# Patient Record
Sex: Female | Born: 1955 | Race: White | Hispanic: No | Marital: Single | Smoking: Former smoker
Health system: Southern US, Community
[De-identification: ages and names within clinical notes are randomized; demographics above are authoritative.]

## PROBLEM LIST (undated history)

## (undated) DIAGNOSIS — K219 Gastro-esophageal reflux disease without esophagitis: Secondary | ICD-10-CM

## (undated) DIAGNOSIS — G5760 Lesion of plantar nerve, unspecified lower limb: Secondary | ICD-10-CM

## (undated) DIAGNOSIS — K589 Irritable bowel syndrome without diarrhea: Secondary | ICD-10-CM

## (undated) DIAGNOSIS — M810 Age-related osteoporosis without current pathological fracture: Secondary | ICD-10-CM

## (undated) DIAGNOSIS — K259 Gastric ulcer, unspecified as acute or chronic, without hemorrhage or perforation: Secondary | ICD-10-CM

## (undated) DIAGNOSIS — C801 Malignant (primary) neoplasm, unspecified: Secondary | ICD-10-CM

## (undated) HISTORY — DX: Irritable bowel syndrome without diarrhea: K58.9

## (undated) HISTORY — DX: Gastric ulcer, unspecified as acute or chronic, without hemorrhage or perforation: K25.9

## (undated) HISTORY — PX: BREAST BIOPSY: SHX20

## (undated) HISTORY — PX: BREAST CYST ASPIRATION: SHX578

## (undated) HISTORY — DX: Gastro-esophageal reflux disease without esophagitis: K21.9

## (undated) HISTORY — DX: Age-related osteoporosis without current pathological fracture: M81.0

## (undated) HISTORY — DX: Lesion of plantar nerve, unspecified lower limb: G57.60

---

## 1978-09-20 DIAGNOSIS — C801 Malignant (primary) neoplasm, unspecified: Secondary | ICD-10-CM

## 1978-09-20 HISTORY — DX: Malignant (primary) neoplasm, unspecified: C80.1

## 2004-01-22 ENCOUNTER — Emergency Department: Payer: Self-pay | Admitting: Emergency Medicine

## 2004-07-30 ENCOUNTER — Ambulatory Visit: Payer: Self-pay

## 2004-08-07 ENCOUNTER — Ambulatory Visit: Payer: Self-pay

## 2004-10-22 ENCOUNTER — Emergency Department: Payer: Self-pay | Admitting: Emergency Medicine

## 2005-03-11 ENCOUNTER — Ambulatory Visit: Payer: Self-pay

## 2007-02-22 IMAGING — US ULTRASOUND RIGHT BREAST
1 series · 9 of 9 positions shown · non-contrast
Comparison: none

REASON FOR EXAM: Mobile nodule, 7 o'clock right breast
COMMENTS:

[Series 1: ultrasound right breast · 9 of 9 slices shown]
[im 1/9]
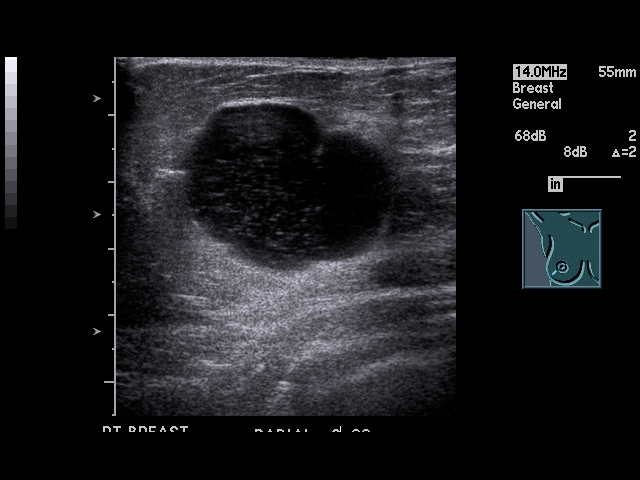
[im 2/9]
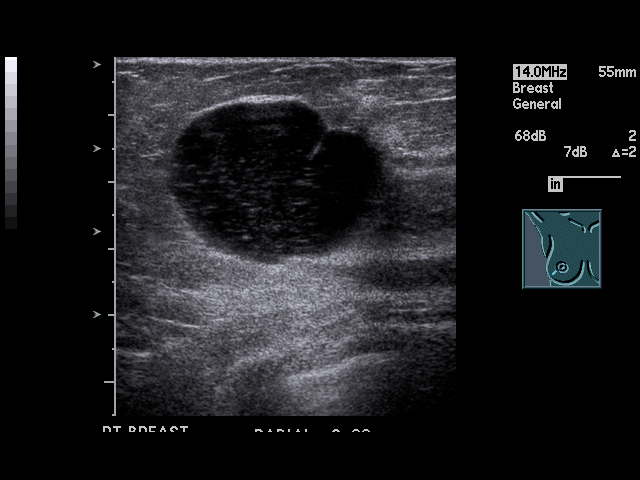
[im 3/9]
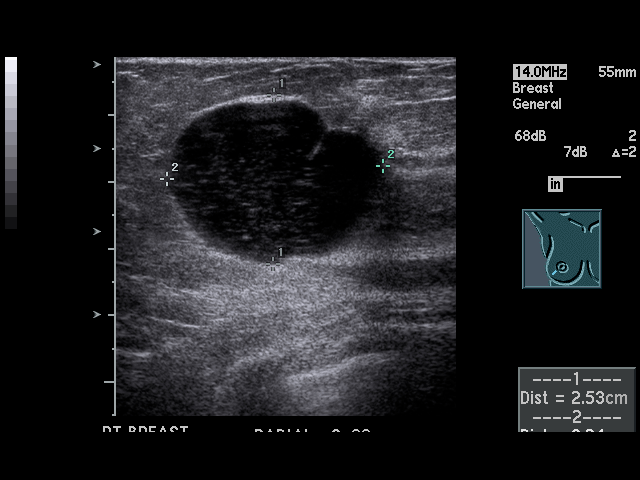
[im 4/9]
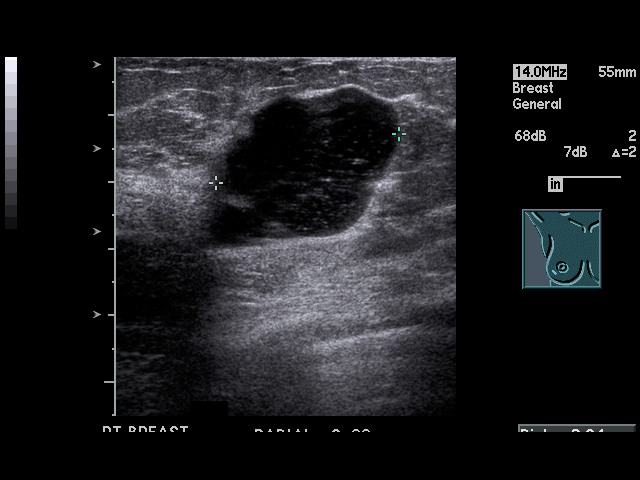
[im 5/9]
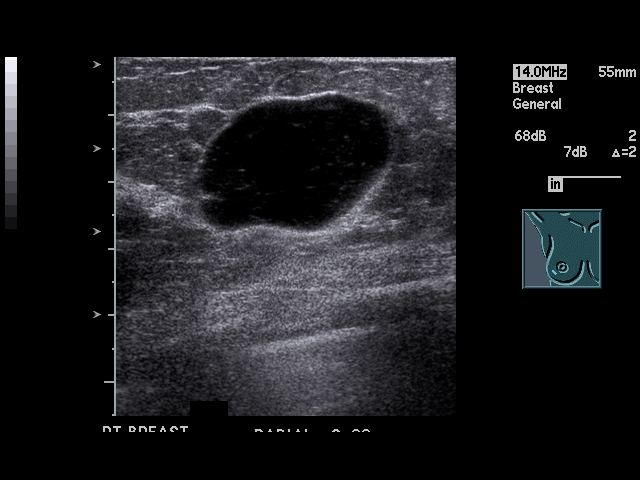
[im 6/9]
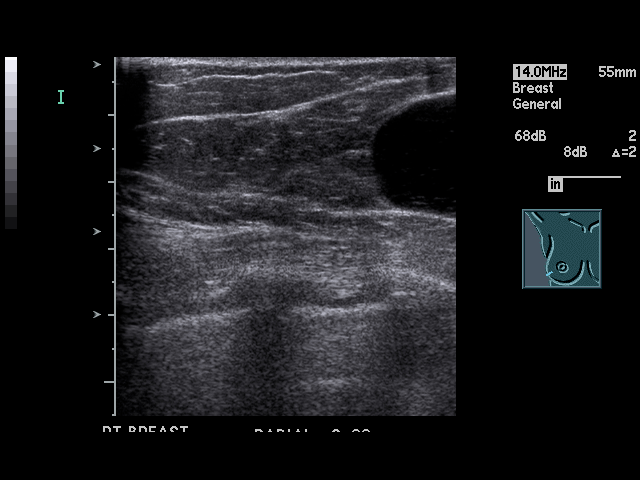
[im 7/9]
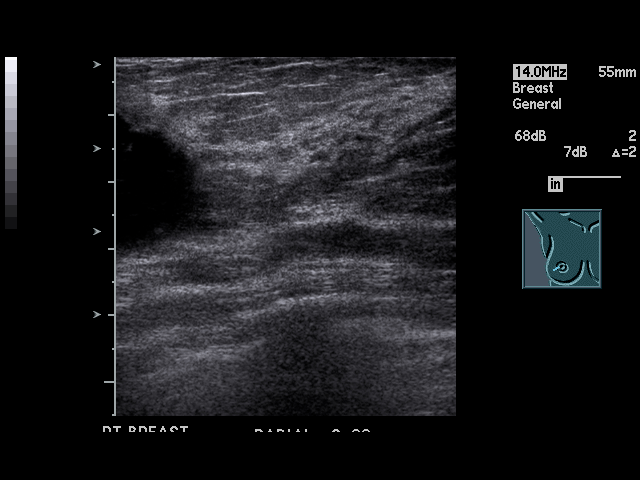
[im 8/9]
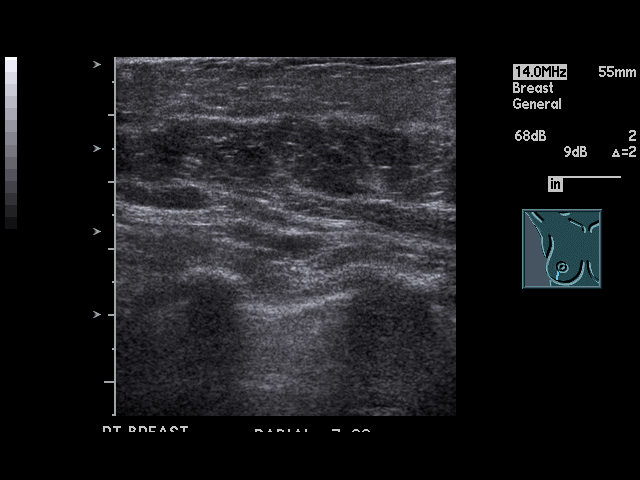
[im 9/9]
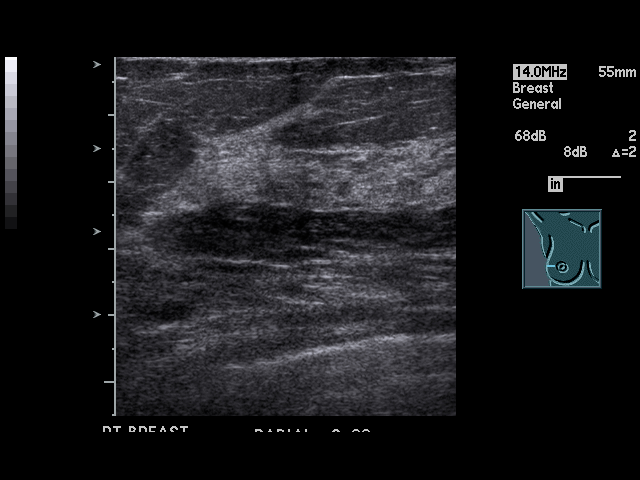

[9 of 9 positions shown; findings below may reference images not displayed]

PROCEDURE:     US  - US BREAST RIGHT  - July 30, 2004 [DATE]

RESULT:     The RIGHT breast is evaluated within the region of interest from
the 8 o'clock to 9 o'clock position.

A cystic structure is appreciated in the 8 o'clock position measuring 2.53 x
3.2 x 2.84 cm. This area does demonstrate increased through transmission
though there are internal echoes within this cyst and areas of possible wall
thickening. Due to these findings, further evaluation with cyst aspiration
is recommended. No further sonographic abnormalities are appreciated.
IMPRESSION: Cystic structure appreciated at the 8 o'clock position as
described above. Due to the sonographic characteristics, further evaluation
with aspiration is recommended.

## 2007-10-26 DIAGNOSIS — G2581 Restless legs syndrome: Secondary | ICD-10-CM | POA: Insufficient documentation

## 2007-12-06 ENCOUNTER — Ambulatory Visit: Payer: Self-pay

## 2008-10-18 DIAGNOSIS — M24469 Recurrent dislocation, unspecified knee: Secondary | ICD-10-CM | POA: Insufficient documentation

## 2009-03-13 DIAGNOSIS — J309 Allergic rhinitis, unspecified: Secondary | ICD-10-CM | POA: Insufficient documentation

## 2009-06-04 ENCOUNTER — Ambulatory Visit: Payer: Self-pay

## 2009-08-29 DIAGNOSIS — L570 Actinic keratosis: Secondary | ICD-10-CM | POA: Insufficient documentation

## 2010-06-18 ENCOUNTER — Ambulatory Visit: Payer: Self-pay | Admitting: Family Medicine

## 2011-12-29 ENCOUNTER — Ambulatory Visit: Payer: Self-pay

## 2012-07-18 DIAGNOSIS — J358 Other chronic diseases of tonsils and adenoids: Secondary | ICD-10-CM | POA: Insufficient documentation

## 2013-03-27 DIAGNOSIS — C44111 Basal cell carcinoma of skin of unspecified eyelid, including canthus: Secondary | ICD-10-CM | POA: Insufficient documentation

## 2013-08-14 DIAGNOSIS — R195 Other fecal abnormalities: Secondary | ICD-10-CM | POA: Insufficient documentation

## 2013-09-04 ENCOUNTER — Ambulatory Visit: Payer: Self-pay | Admitting: Gastroenterology

## 2013-09-04 LAB — KOH PREP

## 2013-09-06 LAB — PATHOLOGY REPORT

## 2013-09-18 DIAGNOSIS — G43119 Migraine with aura, intractable, without status migrainosus: Secondary | ICD-10-CM | POA: Insufficient documentation

## 2013-10-09 DIAGNOSIS — K253 Acute gastric ulcer without hemorrhage or perforation: Secondary | ICD-10-CM | POA: Insufficient documentation

## 2013-10-09 DIAGNOSIS — K2100 Gastro-esophageal reflux disease with esophagitis, without bleeding: Secondary | ICD-10-CM | POA: Insufficient documentation

## 2013-11-20 ENCOUNTER — Ambulatory Visit: Payer: Self-pay | Admitting: Gastroenterology

## 2013-12-21 DIAGNOSIS — M810 Age-related osteoporosis without current pathological fracture: Secondary | ICD-10-CM | POA: Insufficient documentation

## 2013-12-21 DIAGNOSIS — D649 Anemia, unspecified: Secondary | ICD-10-CM | POA: Insufficient documentation

## 2013-12-21 DIAGNOSIS — L509 Urticaria, unspecified: Secondary | ICD-10-CM | POA: Insufficient documentation

## 2014-05-14 LAB — SURGICAL PATHOLOGY

## 2014-06-04 DIAGNOSIS — K5790 Diverticulosis of intestine, part unspecified, without perforation or abscess without bleeding: Secondary | ICD-10-CM | POA: Insufficient documentation

## 2015-01-28 ENCOUNTER — Ambulatory Visit: Payer: Self-pay

## 2015-02-04 ENCOUNTER — Ambulatory Visit: Payer: Medicaid Other

## 2015-02-04 ENCOUNTER — Encounter: Payer: Self-pay | Admitting: Podiatry

## 2015-02-04 ENCOUNTER — Ambulatory Visit (INDEPENDENT_AMBULATORY_CARE_PROVIDER_SITE_OTHER): Payer: Medicaid Other | Admitting: Podiatry

## 2015-02-04 ENCOUNTER — Ambulatory Visit (INDEPENDENT_AMBULATORY_CARE_PROVIDER_SITE_OTHER): Payer: Medicaid Other

## 2015-02-04 VITALS — BP 124/82 | HR 70 | Resp 16 | Ht 61.0 in | Wt 170.0 lb

## 2015-02-04 DIAGNOSIS — M722 Plantar fascial fibromatosis: Secondary | ICD-10-CM | POA: Diagnosis not present

## 2015-02-04 DIAGNOSIS — M79673 Pain in unspecified foot: Secondary | ICD-10-CM

## 2015-02-04 MED ORDER — TRIAMCINOLONE ACETONIDE 10 MG/ML IJ SUSP
10.0000 mg | Freq: Once | INTRAMUSCULAR | Status: AC
  Administered 2015-02-04: 10 mg

## 2015-02-04 NOTE — Progress Notes (Signed)
   Subjective:    Patient ID: Amy Bell, female    DOB: 11/21/1955, 60 y.o.   MRN: [id number]  HPI Patient presents with foot pain in their Left foot; Spot on tarsal joint (looks like vein). On Right foot-heel; Pt stated, "Went to [facility] and was diagnosed with Plantar Fasciitis"; Pt wants 2nd opinion.  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 Has had surgery in 2011 on Left foot. Dr. Evelina Bucy out neuroma in Left foot. Pt used Dr. Milus Glazier from [facility].  [place]: Positive for hearing loss.   Skin: Positive for color change.  All other systems reviewed and are negative.      Objective:   Physical Exam        Assessment & Plan:

## 2015-02-04 NOTE — Patient Instructions (Signed)

## 2015-02-06 NOTE — Progress Notes (Signed)
Subjective:     Patient ID: Amy Bell, female   DOB: 1955-03-16, 60 y.o.   MRN: [id number]  HPI patient states that she's had long-term history of right heel pain and also mild discomfort on the dorsum of the left foot. Has had history of Morton's surgery thousand 11 left foot and states that she's had previous cortisone injection which only helped her for a short period of time right   Review of Systems  All other systems reviewed and are negative.      Objective:   Physical Exam  Constitutional: She is oriented to person, place, and time.  Cardiovascular: Intact distal pulses.   Musculoskeletal: Normal range of motion.  Neurological: She is oriented to person, place, and time.  Skin: Skin is warm.  Nursing note and vitals reviewed.  neurovascular status found to be intact muscle strength adequate range of motion within normal limits with patient having exquisite discomfort plantar aspect right heel and mild to moderate discomfort dorsum left foot. Patient has good digital perfusion and is well oriented 3     Assessment:     Inflammatory fasciitis right with inflammation of the medial band and mild dorsal tendinitis which may be compensatory left    Plan:     H&P and x-rays reviewed with patient. Today I injected the plantar fascia right 3 mg Kenalog 5 mg Xylocaine and dispensed a fascial brace and night splint. Patient will utilize heat and ice therapy dorsum left and we reviewed x-rays will be seen back to recheck

## 2015-02-11 ENCOUNTER — Ambulatory Visit: Admission: RE | Admit: 2015-02-11 | Payer: Medicaid Other | Source: Ambulatory Visit | Admitting: Gastroenterology

## 2015-02-11 ENCOUNTER — Encounter: Admission: RE | Payer: Self-pay | Source: Ambulatory Visit

## 2015-02-11 SURGERY — ESOPHAGOGASTRODUODENOSCOPY (EGD) WITH PROPOFOL
Anesthesia: General

## 2015-02-18 ENCOUNTER — Ambulatory Visit: Payer: Medicaid Other

## 2015-02-18 ENCOUNTER — Other Ambulatory Visit: Payer: Self-pay | Admitting: Family Medicine

## 2015-02-18 ENCOUNTER — Ambulatory Visit
Admission: RE | Admit: 2015-02-18 | Discharge: 2015-02-18 | Disposition: A | Discharge status: Home / Self Care | Payer: Medicaid Other | Source: Ambulatory Visit | Attending: Family Medicine | Admitting: Family Medicine

## 2015-02-18 DIAGNOSIS — Z1231 Encounter for screening mammogram for malignant neoplasm of breast: Secondary | ICD-10-CM | POA: Insufficient documentation

## 2015-02-18 HISTORY — DX: Malignant (primary) neoplasm, unspecified: C80.1

## 2015-03-04 ENCOUNTER — Ambulatory Visit: Payer: Medicaid Other | Admitting: Podiatry

## 2015-03-11 ENCOUNTER — Ambulatory Visit (INDEPENDENT_AMBULATORY_CARE_PROVIDER_SITE_OTHER): Payer: Medicaid Other | Admitting: Podiatry

## 2015-03-11 DIAGNOSIS — M722 Plantar fascial fibromatosis: Secondary | ICD-10-CM | POA: Diagnosis not present

## 2015-03-11 MED ORDER — TRIAMCINOLONE ACETONIDE 10 MG/ML IJ SUSP
10.0000 mg | Freq: Once | INTRAMUSCULAR | Status: AC
  Administered 2015-03-11: 10 mg

## 2015-03-12 NOTE — Progress Notes (Signed)
Subjective:     Patient ID: Amy Bell, female   DOB: [birth year], 60 y.o.   MRN: [id number]  HPI patient states that she is still having a lot of pain in her right heel and was hoping it would be improved more than wear it is right now   Review of Systems     Objective:   Physical Exam  neurovascular status intact with continued discomfort in the right plantar heel that has improved slightly but is still present with deep palpation    Assessment:      plantar fasciitis right improving but still present    Plan:      reinjected the right plantar fascia 3 Mill grams Kenalog 5 mill grams Xylocaine and instructed on continued brace usage support shoes and night splint

## 2015-04-18 ENCOUNTER — Telehealth: Payer: Self-pay | Admitting: *Deleted

## 2015-04-18 NOTE — Telephone Encounter (Signed)
Pt states she is scheduled to see DR. Regal on 04/23/2015, but the therapies she is trying at home are helping, should she cancel.  Left message encouraging pt to keep the appt unless she is 100% well in case she should have a relapse.

## 2015-04-22 ENCOUNTER — Ambulatory Visit: Payer: Medicaid Other | Admitting: Podiatry

## 2015-04-29 ENCOUNTER — Ambulatory Visit (INDEPENDENT_AMBULATORY_CARE_PROVIDER_SITE_OTHER): Payer: Medicaid Other | Admitting: Podiatry

## 2015-04-29 ENCOUNTER — Encounter: Payer: Self-pay | Admitting: Podiatry

## 2015-04-29 DIAGNOSIS — M722 Plantar fascial fibromatosis: Secondary | ICD-10-CM

## 2015-05-01 NOTE — Progress Notes (Signed)
Subjective:     Patient ID: Amy Bell, female   DOB: [birth year], 60 y.o.   MRN: [id number]  HPI patient states my foot is feeling some better   Review of Systems     Objective:   Physical Exam Neurovascular status intact with continued discomfort right plantar fascia that still present but improved with moderate depression of the arch noted    Assessment:     Plantar fasciitis still present with mild to moderate improvement    Plan:     Advised on physical therapy supportive shoes and anti-inflammatories and reappoint as needed

## 2015-07-18 ENCOUNTER — Ambulatory Visit: Payer: Medicaid Other | Admitting: Podiatry

## 2016-05-18 ENCOUNTER — Ambulatory Visit: Payer: Self-pay

## 2016-05-20 ENCOUNTER — Emergency Department
Admission: EM | Admit: 2016-05-20 | Discharge: 2016-05-20 | Disposition: A | Discharge status: Home / Self Care | Payer: Self-pay | Attending: Student in an Organized Health Care Education/Training Program | Admitting: Student in an Organized Health Care Education/Training Program

## 2016-05-20 ENCOUNTER — Encounter: Payer: Self-pay | Admitting: Emergency Medicine

## 2016-05-20 ENCOUNTER — Emergency Department: Payer: Self-pay

## 2016-05-20 DIAGNOSIS — Z8541 Personal history of malignant neoplasm of cervix uteri: Secondary | ICD-10-CM | POA: Insufficient documentation

## 2016-05-20 DIAGNOSIS — Y929 Unspecified place or not applicable: Secondary | ICD-10-CM | POA: Insufficient documentation

## 2016-05-20 DIAGNOSIS — S0083XA Contusion of other part of head, initial encounter: Secondary | ICD-10-CM | POA: Insufficient documentation

## 2016-05-20 DIAGNOSIS — Y999 Unspecified external cause status: Secondary | ICD-10-CM | POA: Insufficient documentation

## 2016-05-20 DIAGNOSIS — Z87891 Personal history of nicotine dependence: Secondary | ICD-10-CM | POA: Insufficient documentation

## 2016-05-20 DIAGNOSIS — Y9301 Activity, walking, marching and hiking: Secondary | ICD-10-CM | POA: Insufficient documentation

## 2016-05-20 DIAGNOSIS — W01110A Fall on same level from slipping, tripping and stumbling with subsequent striking against sharp glass, initial encounter: Secondary | ICD-10-CM | POA: Insufficient documentation

## 2016-05-20 NOTE — ED Notes (Signed)
See triage note  States she tripped on Monday fell face first... Abrasions to forehead and bridge of nose  Periorbital bruising noted  Denies any LOC but now having some nausea and pain when bending over

## 2016-05-20 NOTE — ED Triage Notes (Signed)
States tripped and fell 2 days ago. Hit face, had glasses on, periorbital ecchymosis. Bandage to nose bridge and forehead which she states have abrasions under. Denies LOC. Denies taking blood thinners except occasional aspirin. Concerned because this am she had nausea and dizziness. States dizziness has resolved but nausea remains.

## 2016-05-20 NOTE — ED Provider Notes (Signed)
[facility] Emergency Department Provider Note  ____________________________________________  Time seen: Approximately 7:15 PM  I have reviewed the triage vital signs and the nursing notes.   HISTORY  Chief Complaint Facial Injury    HPI Amy Bell is a 61 y.o. female presenting to the emergency department with facial pain, vertigo and nausea that started 2 days ago. Patient states that she was walking on Monday when she tripped and had facial impact on concrete. Patient states that her glasses embedded into her face. She has noticed bilateral periorbital ecchymosis. Patient experienced no loss of consciousness. She denies new blurry vision. She denies chest pain, chest tightness and vomiting. Patient has sustained numerous abrasions to forehead and bridge of nose. Patient denies a history of prior traumatic brain injury. Patient is concerned because she engages in repetitive tasks at work which involved flexion at the spine and patient states that she feels increased nausea and vertigo while engaging in aforementioned tasks. Patient denies associated neck pain, back pain or radiculopathy. No alleviating measures have been undertaken. Patient occasionally takes aspirin uses no other blood thinners.   Past Medical History:  Diagnosis Date  . 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 GERD (gastroesophageal reflux disease)   . IBS (irritable bowel syndrome)   . Morton's neuroma    Right foot  . Osteoporosis   . Stomach ulcer     There are no active problems to display for this patient.   Past Surgical History:  Procedure Laterality Date  . BREAST BIOPSY Right 10+YRS AGO   CORE W/CLIP - NEG  . BREAST CYST ASPIRATION Bilateral    MULTIPLE    Prior to Admission medications   Medication Sig Start Date End Date Taking? Authorizing Provider  fluocinonide (LIDEX) 0.05 % external solution BID to scalp 01/03/15   Historical Provider, MD  fluticasone (FLONASE)  50 MCG/ACT nasal spray by Nasal route.    Historical Provider, MD  hyoscyamine (LEVBID) 0.375 MG 12 hr tablet 0.375 mg. 09/10/14   Historical Provider, MD  pantoprazole (PROTONIX) 40 MG tablet Take 40 mg by mouth. 10/09/13   Historical Provider, MD  zolpidem (AMBIEN) 5 MG tablet Take 5 mg by mouth as needed.     Historical Provider, MD    Allergies Ondansetron hcl; Amoxicillin; Levofloxacin; Nitrofurantoin; and Tape  Family History  Problem Relation Age of Onset  . Breast cancer Mother     45'S    Social History Social History  Substance Use Topics  . Smoking status: Former Research scientist (life sciences)  . Smokeless tobacco: Not on file  . Alcohol use Not on file    Review of Systems  Constitutional: No fever/chills Eyes: No visual changes. No discharge ENT: No upper respiratory complaints. Cardiovascular: no chest pain. Respiratory: no cough. No SOB. Gastrointestinal: Patient has had nausea. Musculoskeletal: Negative for musculoskeletal pain. Skin: Patient has bilateral periorbital ecchymosis. Patient has abrasions of the nose and forehead. Neurological: Negative for headaches, focal weakness or numbness.  ____________________________________________   PHYSICAL EXAM:  VITAL SIGNS: ED Triage Vitals  Enc Vitals Group     BP 05/20/16 1827 (!) 139/45     Pulse Rate 05/20/16 1827 78     Resp 05/20/16 1827 20     Temp 05/20/16 1827 98.2 F (36.8 C)     Temp Source 05/20/16 1827 Oral     SpO2 05/20/16 1827 100 %     Weight 05/20/16 1829 175 lb (79.4 kg)     Height 05/20/16  1829 5\' 1"  (1.549 m)     Head Circumference --      Peak Flow --      Pain Score 05/20/16 1827 3     Pain Loc --      Pain Edu? --      Excl. in [place]? --      Constitutional: Alert and oriented. Well appearing and in no acute distress. Eyes: Conjunctivae are normal. PERRL. EOMI. Head: Atraumatic. Patient has bilateral periorbital ecchymosis. No palpable focal edema overlying the scalp. Patient has numerous abrasions of  the forehead and the bridge of nose. ENT:      Ears: Tymanic membranes are pearly bilaterally without bloody effusion.       Nose: No congestion/rhinnorhea.      Mouth/Throat: Mucous membranes are moist. Uvula is midline. Airway is patent. Neck: No stridor. Full range of motion with no pain elicited with palpation along the C-spine. No radiculopathy was elicited. Cardiovascular: Normal rate, regular rhythm. Normal S1 and S2.  Good peripheral circulation. Patient has heart murmur auscultated best over the aortic region. Respiratory: Normal respiratory effort without tachypnea or retractions. Lungs CTAB. Good air entry to the bases with no decreased or absent breath sounds. Musculoskeletal: Full range of motion to all extremities. No gross deformities appreciated. Neurologic:  Normal speech and language. No gross focal neurologic deficits are appreciated.  Skin: See head physical exam. No bruising, lacerations or skin compromise aside from facial involvement. Psychiatric: Mood and affect are normal. Speech and behavior are normal. Patient exhibits appropriate insight and judgement.   ____________________________________________   LABS (all labs ordered are listed, but only abnormal results are displayed)  Labs Reviewed - No data to display ____________________________________________  EKG   ____________________________________________  RADIOLOGY Unk Pinto, personally viewed and evaluated these images (plain radiographs) as part of my medical decision making, as well as reviewing the written report by the radiologist.   Ct Head Wo Contrast  Result Date: 05/20/2016 CLINICAL DATA:  Trip and fall 2 days ago with facial injury and periorbital bruising EXAM: CT HEAD WITHOUT CONTRAST CT MAXILLOFACIAL WITHOUT CONTRAST TECHNIQUE: Multidetector CT imaging of the head and maxillofacial structures were performed using the standard protocol without intravenous contrast. Multiplanar CT image  reconstructions of the maxillofacial structures were also generated. COMPARISON:  None. FINDINGS: CT HEAD FINDINGS Brain: Mild atrophic changes are noted. No findings to suggest acute hemorrhage, acute infarction or space-occupying mass lesion are noted. Vascular: No hyperdense vessel or unexpected calcification. Skull: Normal. Negative for fracture or focal lesion. Other: None. CT MAXILLOFACIAL FINDINGS Osseous: No fracture or mandibular dislocation. No destructive process. There is significant sclerosis of the C3 vertebral body likely related to a bone island Orbits: Negative. No traumatic or inflammatory finding. Sinuses: Clear. Soft tissues: Mild soft tissue swelling is noted about the left orbit consistent with the recent injury. IMPRESSION: CT of the head: Chronic atrophic changes without acute abnormality. CT of the maxillofacial bones: No acute bony abnormality identified. 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 Mild soft tissue swelling near the left orbit. Electronically Signed   By: Inez Catalina M.D.   On: 05/20/2016 19:37   Ct Maxillofacial Wo Contrast  Result Date: 05/20/2016 CLINICAL DATA:  Trip and fall 2 days ago with facial injury and periorbital bruising EXAM: CT HEAD WITHOUT CONTRAST CT MAXILLOFACIAL WITHOUT CONTRAST TECHNIQUE: Multidetector CT imaging of the head and maxillofacial structures were performed using the standard protocol without intravenous contrast. Multiplanar CT image reconstructions of  the maxillofacial structures were also generated. COMPARISON:  None. FINDINGS: CT HEAD FINDINGS Brain: Mild atrophic changes are noted. No findings to suggest acute hemorrhage, acute infarction or space-occupying mass lesion are noted. Vascular: No hyperdense vessel or unexpected calcification. Skull: Normal. Negative for fracture or focal lesion. Other: None. CT MAXILLOFACIAL FINDINGS Osseous: No fracture or mandibular dislocation. No destructive process. There is significant sclerosis of  the C3 vertebral body likely related to a bone island Orbits: Negative. No traumatic or inflammatory finding. Sinuses: Clear. Soft tissues: Mild soft tissue swelling is noted about the left orbit consistent with the recent injury. IMPRESSION: CT of the head: Chronic atrophic changes without acute abnormality. CT of the maxillofacial bones: No acute bony abnormality identified. 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 Mild soft tissue swelling near the left orbit. Electronically Signed   By: Inez Catalina M.D.   On: 05/20/2016 19:37    ____________________________________________    PROCEDURES  Procedure(s) performed:    Procedures    Medications - No data to display   ____________________________________________   INITIAL IMPRESSION / ASSESSMENT AND PLAN / ED COURSE  Pertinent labs & imaging results that were available during my care of the patient were reviewed by me and considered in my medical decision making (see chart for details).  Review of the [place] CSRS was performed in accordance of the [place] prior to dispensing any controlled drugs.     Assessment and plan: Facial contusion Patient presents to the emergency department with facial pain, nausea and vertigo after having a fall 2 days ago. Neurologic exam and overall physical exam are reassuring. CT maxillofacial and CT head without contrast revealed no acute intracranial abnormality. Patient declined pain medications at discharge. Vital signs were reassuring prior to discharge. All patient questions were answered.   ____________________________________________  FINAL CLINICAL IMPRESSION(S) / ED DIAGNOSES  Final diagnoses:  Contusion of face, initial encounter      NEW MEDICATIONS STARTED DURING THIS VISIT:  New Prescriptions   No medications on file        This chart was dictated using voice recognition software/Dragon. Despite best efforts to proofread, errors can occur which can change the meaning. Any  change was purely unintentional.    Lannie Fields, PA-C 05/20/16 [place], MD 05/21/16 [id number]

## 2016-06-08 ENCOUNTER — Ambulatory Visit
Admission: RE | Admit: 2016-06-08 | Discharge: 2016-06-08 | Disposition: A | Discharge status: Home / Self Care | Payer: Self-pay | Source: Ambulatory Visit | Attending: Oncology | Admitting: Oncology

## 2016-06-08 ENCOUNTER — Encounter: Payer: Self-pay | Admitting: *Deleted

## 2016-06-08 ENCOUNTER — Ambulatory Visit: Payer: Self-pay | Attending: Oncology | Admitting: *Deleted

## 2016-06-08 VITALS — BP 131/79 | HR 68 | Temp 97.8°F | Ht 62.0 in | Wt 171.0 lb

## 2016-06-08 DIAGNOSIS — Z0000 Encounter for general adult medical examination without abnormal findings: Secondary | ICD-10-CM | POA: Insufficient documentation

## 2016-06-08 NOTE — Progress Notes (Signed)
Subjective:     Patient ID: Amy Bell, female   DOB: 1955/04/11, 61 y.o.   MRN: [id number]  HPI   Review of Systems     Objective:   Physical Exam  Pulmonary/Chest: Right breast exhibits no inverted nipple, no mass, no nipple discharge, no skin change and no tenderness. Left breast exhibits inverted nipple. Left breast exhibits no mass, no nipple discharge, no skin change and no tenderness. Breasts are symmetrical.  Left inverted nipple is normal per patient       Assessment:     61 year old White female returns to [facility] for clinical breast and mammogram only.  Clinical breast exam unremarkable.  Taught self breast awareness.  Patient has been screened for eligibility.  She does not have any insurance, Medicare or Medicaid.  She also meets financial eligibility.  Hand-out given on the Affordable Care Act.    Plan:     Screening mammogram ordered.  Will follow up per [facility] protocol.

## 2016-06-08 NOTE — Patient Instructions (Signed)
Gave patient hand-out, Women Staying Healthy, Active and Well from [facility], with education on breast health, pap smears, heart and colon health. 

## 2016-06-09 ENCOUNTER — Encounter: Payer: Self-pay | Admitting: *Deleted

## 2016-06-09 NOTE — Progress Notes (Signed)
Letter mailed from the [facility] to inform patient of her normal mammogram results.  Patient is to follow-up with annual screening in one year.  HSIS to [place]. 

## 2016-06-22 ENCOUNTER — Ambulatory Visit: Admission: RE | Admit: 2016-06-22 | Payer: Medicaid Other | Source: Ambulatory Visit | Admitting: Gastroenterology

## 2016-06-22 ENCOUNTER — Encounter: Admission: RE | Payer: Self-pay | Source: Ambulatory Visit

## 2016-06-22 SURGERY — ESOPHAGOGASTRODUODENOSCOPY (EGD) WITH PROPOFOL
Anesthesia: General

## 2016-07-06 ENCOUNTER — Emergency Department
Admission: EM | Admit: 2016-07-06 | Discharge: 2016-07-06 | Disposition: A | Discharge status: Home / Self Care | Payer: Self-pay | Attending: Emergency Medicine | Admitting: Emergency Medicine

## 2016-07-06 DIAGNOSIS — Y939 Activity, unspecified: Secondary | ICD-10-CM | POA: Insufficient documentation

## 2016-07-06 DIAGNOSIS — Y929 Unspecified place or not applicable: Secondary | ICD-10-CM | POA: Insufficient documentation

## 2016-07-06 DIAGNOSIS — Y999 Unspecified external cause status: Secondary | ICD-10-CM | POA: Insufficient documentation

## 2016-07-06 DIAGNOSIS — W19XXXD Unspecified fall, subsequent encounter: Secondary | ICD-10-CM | POA: Insufficient documentation

## 2016-07-06 DIAGNOSIS — S0083XD Contusion of other part of head, subsequent encounter: Secondary | ICD-10-CM | POA: Insufficient documentation

## 2016-07-06 DIAGNOSIS — Z87891 Personal history of nicotine dependence: Secondary | ICD-10-CM | POA: Insufficient documentation

## 2016-07-06 DIAGNOSIS — Z8541 Personal history of malignant neoplasm of cervix uteri: Secondary | ICD-10-CM | POA: Insufficient documentation

## 2016-07-06 DIAGNOSIS — M792 Neuralgia and neuritis, unspecified: Secondary | ICD-10-CM | POA: Insufficient documentation

## 2016-07-06 DIAGNOSIS — Z79899 Other long term (current) drug therapy: Secondary | ICD-10-CM | POA: Insufficient documentation

## 2016-07-06 NOTE — ED Notes (Signed)
See triage note  States she was seen about 6 weeks ago s/p fall   States she still has small hematoma over left eye with intermittent pain

## 2016-07-06 NOTE — ED Triage Notes (Signed)
Pt states that she fell approx 6 week ago, had CT of head at that time. PT reporting that above left eye she feels sharp pains and it hurts when she frowns in that area. Pt alert and oriented X4, active, cooperative, pt in NAD. RR even and unlabored, color WNL.

## 2016-07-06 NOTE — Discharge Instructions (Signed)
Follow-up with your primary care doctor if any continued problems for further testing and evaluation.

## 2016-07-06 NOTE — ED Provider Notes (Signed)
[facility] Emergency Department Provider Note   ____________________________________________   First MD Initiated Contact with Patient 07/06/16 1336     (approximate)  I have reviewed the triage vital signs and the nursing notes.   HISTORY  Chief Complaint Follow-up    HPI Amy Bell is a 61 y.o. female states that she is here for a "followed". Patient states that approximately 6 weeks ago she fell and was seen in the emergency room at that time. Patient states that she has CT of her head and was told that this was negative. She has continued "lump" on her forehead that occasionally gives sharp pains especially if she frowns in that area. Patient denies any visual changes or headaches. She has continued to work normally. She states that she has not followed up with her PCP. Currently she rates her pain as a 0/10.   Past Medical History:  Diagnosis Date  . 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 GERD (gastroesophageal reflux disease)   . IBS (irritable bowel syndrome)   . Morton's neuroma    Right foot  . Osteoporosis   . Stomach ulcer     There are no active problems to display for this patient.   Past Surgical History:  Procedure Laterality Date  . BREAST BIOPSY Right 10+YRS AGO   CORE W/CLIP - NEG  . BREAST CYST ASPIRATION Bilateral    MULTIPLE    Prior to Admission medications   Medication Sig Start Date End Date Taking? Authorizing Provider  fluocinonide (LIDEX) 0.05 % external solution BID to scalp 01/03/15   [provider]  fluticasone (FLONASE) 50 MCG/ACT nasal spray by Nasal route.    [provider]  hyoscyamine (LEVBID) 0.375 MG 12 hr tablet 0.375 mg. 09/10/14   [provider]  pantoprazole (PROTONIX) 40 MG tablet Take 40 mg by mouth. 10/09/13   [provider]  zolpidem (AMBIEN) 5 MG tablet Take 5 mg by mouth as needed.     [provider]    Allergies Ondansetron hcl;  Amoxicillin; Levofloxacin; Nitrofurantoin; and Tape  Family History  Problem Relation Age of Onset  . Breast cancer Mother        50'S    Social History Social History  Substance Use Topics  . Smoking status: Former Research scientist (life sciences)  . Smokeless tobacco: Not on file  . Alcohol use No    Review of Systems Constitutional: No fever/chills Eyes: No visual changes. Cardiovascular: Denies chest pain. Respiratory: Denies shortness of breath. Musculoskeletal: Negative for back pain. Skin: "Lump" on forehead. Neurological: Negative for headaches, focal weakness or numbness.   ____________________________________________   PHYSICAL EXAM:  VITAL SIGNS: ED Triage Vitals  Enc Vitals Group     BP 07/06/16 1227 (!) 151/83     Pulse Rate 07/06/16 1227 84     Resp 07/06/16 1227 18     Temp 07/06/16 1227 98.5 F (36.9 C)     Temp Source 07/06/16 1227 Oral     SpO2 07/06/16 1227 93 %     Weight 07/06/16 1228 170 lb (77.1 kg)     Height 07/06/16 1228 5\' 1"  (1.549 m)     Head Circumference --      Peak Flow --      Pain Score 07/06/16 1227 0     Pain Loc --      Pain Edu? --      Excl. in [place]? --     Constitutional: Alert  and oriented. Well appearing and in no acute distress. Eyes: Conjunctivae are normal. PERRL. EOMI. Eye brows are uniform bilaterally. No deficit is noted with movement. Head: Atraumatic. Neck: No stridor.   Cardiovascular: Normal rate, regular rhythm. Grossly normal heart sounds.  Good peripheral circulation. Respiratory: Normal respiratory effort.  No retractions. Lungs CTAB. Musculoskeletal: No lower extremity tenderness nor edema.  Normal gait was noted. Neurologic:  Normal speech and language. No gross focal neurologic deficits are appreciated. Cranial nerves II through XII grossly intact. No gait instability. Skin:  Skin is warm, dry and intact. There is a small 1 cm nodule on the medial aspect of the left eyebrow without any erythema or drainage. There is no warmth  that area slightly tender. No deformity is noted surrounding this area. Psychiatric: Mood and affect are normal. Speech and behavior are normal.  ____________________________________________   LABS (all labs ordered are listed, but only abnormal results are displayed)  Labs Reviewed - No data to display   PROCEDURES  Procedure(s) performed: None  Procedures  Critical Care performed: No  ____________________________________________   INITIAL IMPRESSION / ASSESSMENT AND PLAN / ED COURSE  Pertinent labs & imaging results that were available during my care of the patient were reviewed by me and considered in my medical decision making (see chart for details).  CT scans from initial visit was reviewed. Patient was reassured that there was no evidence of fracture or head injury. Patient is to follow-up with her PCP she is also encouraged to take over-the-counter medication for inflammation such as ibuprofen. Given her contusion she is reassured that this is not unusual and should resolve. Follow up with her PCP for further evaluation if she continues to have problems.      ____________________________________________   FINAL CLINICAL IMPRESSION(S) / ED DIAGNOSES  Final diagnoses:  Contusion of forehead, subsequent encounter  Neuralgia      NEW MEDICATIONS STARTED DURING THIS VISIT:  Discharge Medication List as of 07/06/2016  2:04 PM       Note:  This document was prepared using Dragon voice recognition software and may include unintentional dictation errors.    Johnn Hai, PA-C 07/06/16 1529    Harvest Dark, MD 07/07/16 2255

## 2017-06-23 ENCOUNTER — Emergency Department: Payer: Self-pay

## 2017-06-23 ENCOUNTER — Encounter: Payer: Self-pay | Admitting: Emergency Medicine

## 2017-06-23 ENCOUNTER — Emergency Department
Admission: EM | Admit: 2017-06-23 | Discharge: 2017-06-23 | Disposition: A | Discharge status: Home / Self Care | Payer: Self-pay | Attending: Emergency Medicine | Admitting: Emergency Medicine

## 2017-06-23 ENCOUNTER — Other Ambulatory Visit: Payer: Self-pay

## 2017-06-23 DIAGNOSIS — K5732 Diverticulitis of large intestine without perforation or abscess without bleeding: Secondary | ICD-10-CM | POA: Insufficient documentation

## 2017-06-23 DIAGNOSIS — Z87891 Personal history of nicotine dependence: Secondary | ICD-10-CM | POA: Insufficient documentation

## 2017-06-23 DIAGNOSIS — Z8541 Personal history of malignant neoplasm of cervix uteri: Secondary | ICD-10-CM | POA: Insufficient documentation

## 2017-06-23 LAB — URINALYSIS, COMPLETE (UACMP) WITH MICROSCOPIC
BACTERIA UA: NONE SEEN
BILIRUBIN URINE: NEGATIVE
Glucose, UA: NEGATIVE mg/dL
Hgb urine dipstick: NEGATIVE
KETONES UR: NEGATIVE mg/dL
Leukocytes, UA: NEGATIVE
Nitrite: NEGATIVE
PH: 5 (ref 5.0–8.0)
Protein, ur: NEGATIVE mg/dL
SPECIFIC GRAVITY, URINE: 1.014 (ref 1.005–1.030)

## 2017-06-23 LAB — CBC
HEMATOCRIT: 40.6 % (ref 35.0–47.0)
Hemoglobin: 13.9 g/dL (ref 12.0–16.0)
MCH: 29.5 pg (ref 26.0–34.0)
MCHC: 34.2 g/dL (ref 32.0–36.0)
MCV: 86.2 fL (ref 80.0–100.0)
PLATELETS: 305 10*3/uL (ref 150–440)
RBC: 4.71 MIL/uL (ref 3.80–5.20)
RDW: 13.3 % (ref 11.5–14.5)
WBC: 8.8 10*3/uL (ref 3.6–11.0)

## 2017-06-23 LAB — COMPREHENSIVE METABOLIC PANEL
ALBUMIN: 3.9 g/dL (ref 3.5–5.0)
ALT: 15 U/L (ref 14–54)
AST: 19 U/L (ref 15–41)
Alkaline Phosphatase: 101 U/L (ref 38–126)
Anion gap: 7 (ref 5–15)
BUN: 15 mg/dL (ref 6–20)
CHLORIDE: 104 mmol/L (ref 101–111)
CO2: 23 mmol/L (ref 22–32)
CREATININE: 0.76 mg/dL (ref 0.44–1.00)
Calcium: 10.8 mg/dL — ABNORMAL HIGH (ref 8.9–10.3)
GFR calc Af Amer: >60 mL/min (ref >60)
GFR calc non Af Amer: >60 mL/min (ref >60)
Glucose, Bld: 93 mg/dL (ref 65–99)
POTASSIUM: 4.2 mmol/L (ref 3.5–5.1)
SODIUM: 134 mmol/L — AB (ref 135–145)
Total Bilirubin: 0.6 mg/dL (ref 0.3–1.2)
Total Protein: 7.2 g/dL (ref 6.5–8.1)

## 2017-06-23 LAB — LIPASE, BLOOD: LIPASE: 27 U/L (ref 11–51)

## 2017-06-23 MED ORDER — IOHEXOL 300 MG/ML  SOLN
100.0000 mL | Freq: Once | INTRAMUSCULAR | Status: AC | PRN
  Administered 2017-06-23: 100 mL via INTRAVENOUS

## 2017-06-23 MED ORDER — OXYCODONE-ACETAMINOPHEN 5-325 MG PO TABS: 1.0000 | ORAL_TABLET | Freq: Three times a day (TID) | ORAL | 0 refills | Status: DC | PRN

## 2017-06-23 MED ORDER — AMOXICILLIN-POT CLAVULANATE 875-125 MG PO TABS: 1.0000 | ORAL_TABLET | Freq: Two times a day (BID) | ORAL | 0 refills | Status: AC

## 2017-06-23 NOTE — ED Notes (Signed)
First Nurse Note:  Patient states she is having "stomach issues", N&V and diarrhea.  Color good.

## 2017-06-23 NOTE — ED Notes (Signed)

## 2017-06-23 NOTE — ED Notes (Signed)
Patient transported to CT 

## 2017-06-23 NOTE — ED Provider Notes (Signed)
[facility] Emergency Department Provider Note       Time seen: ----------------------------------------- 11:58 AM on 06/23/2017 -----------------------------------------   I have reviewed the triage vital signs and the nursing notes.  HISTORY   Chief Complaint Diarrhea and Nausea    HPI Amy Bell is a 62 y.o. female with a history of GERD, IBS, cervical cancer who presents to the ED for diarrhea with lower abdominal cramping.  Patient has been taking Phenergan for nausea which is helping.  Patient states she has been having some "stomach issues".  She reports history of diverticulitis in the distant past about 4 years ago but this feels much differently.  Past Medical History:  Diagnosis Date  . Cancer ([place]) 1980'S   CERVICAL CA  . GERD (gastroesophageal reflux disease)   . IBS (irritable bowel syndrome)   . Morton's neuroma    Right foot  . Osteoporosis   . Stomach ulcer     There are no active problems to display for this patient.   Past Surgical History:  Procedure Laterality Date  . BREAST BIOPSY Right 10+YRS AGO   CORE W/CLIP - NEG  . BREAST CYST ASPIRATION Bilateral    MULTIPLE    Allergies Ondansetron hcl; Amoxicillin; Levofloxacin; Nitrofurantoin; and Tape  Social History Social History   Tobacco Use  . Smoking status: Former Research scientist (life sciences)  . Smokeless tobacco: Never Used  Substance Use Topics  . Alcohol use: No  . Drug use: No   Review of Systems Constitutional: Negative for fever. Cardiovascular: Negative for chest pain. Respiratory: Negative for shortness of breath. Gastrointestinal: Positive for abdominal pain, nausea and diarrhea Genitourinary: Negative for dysuria. Musculoskeletal: Negative for back pain. Skin: Negative for rash. Neurological: Negative for headaches, focal weakness or numbness.  All systems negative/normal/unremarkable except as stated in the  HPI  ____________________________________________   PHYSICAL EXAM:  VITAL SIGNS: ED Triage Vitals  Enc Vitals Group     BP 06/23/17 1005 140/65     Pulse Rate 06/23/17 1005 72     Resp 06/23/17 1005 18     Temp 06/23/17 1005 98.3 F (36.8 C)     Temp Source 06/23/17 1005 Oral     SpO2 06/23/17 1005 98 %     Weight 06/23/17 1006 170 lb (77.1 kg)     Height 06/23/17 1006 5\' 1"  (1.549 m)     Head Circumference --      Peak Flow --      Pain Score 06/23/17 1006 5     Pain Loc --      Pain Edu? --      Excl. in [place]? --    Constitutional: Alert and oriented. Well appearing and in no distress. Eyes: Conjunctivae are normal. Normal extraocular movements. ENT   Head: Normocephalic and atraumatic.   Nose: No congestion/rhinnorhea.   Mouth/Throat: Mucous membranes are moist.   Neck: No stridor. Cardiovascular: Normal rate, regular rhythm. No murmurs, rubs, or gallops. Respiratory: Normal respiratory effort without tachypnea nor retractions. Breath sounds are clear and equal bilaterally. No wheezes/rales/rhonchi. Gastrointestinal: Suprapubic and diffuse lower abdominal tenderness, no rebound or guarding.  Normal bowel sounds. Musculoskeletal: Nontender with normal range of motion in extremities. No lower extremity tenderness nor edema. Neurologic:  Normal speech and language. No gross focal neurologic deficits are appreciated.  Skin:  Skin is warm, dry and intact. No rash noted. Psychiatric: Mood and affect are normal. Speech and behavior are normal.  ____________________________________________  ED COURSE:  As part  of my medical decision making, I reviewed the following data within the [place] History obtained from family if available, nursing notes, old chart and ekg, as well as notes from prior ED visits. Patient presented for abdominal pain, we will assess with labs and imaging as indicated at this time.    Procedures ____________________________________________   LABS (pertinent positives/negatives)  Labs Reviewed  COMPREHENSIVE METABOLIC PANEL - Abnormal; Notable for the following components:      Result Value   Sodium 134 (*)    Calcium 10.8 (*)    All other components within normal limits  URINALYSIS, COMPLETE (UACMP) WITH MICROSCOPIC - Abnormal; Notable for the following components:   Color, Urine YELLOW (*)    APPearance CLEAR (*)    All other components within normal limits  LIPASE, BLOOD  CBC    RADIOLOGY Images were viewed by me  CT the abdomen pelvis with contrast IMPRESSION: Sigmoid diverticulitis without abscess or perforation.  Small amount of nonspecific free pelvic fluid.  Tiny nonobstructing LEFT upper pole renal calculus.  Nonspecific 7 mm low-attenuation lesion RIGHT lobe liver.  Tiny umbilical hernia containing fat.  ____________________________________________  DIFFERENTIAL DIAGNOSIS   UTI, pyelonephritis, renal colic, diverticulitis, appendicitis  FINAL ASSESSMENT AND PLAN  Diverticulitis   Plan: The patient had presented for lower abdominal pain. Patient's labs were reassuring. Patient's imaging did reveal sigmoid diverticulitis.  She will be treated with oral antibiotics and pain medicine and is cleared for outpatient follow-up.   Laurence Aly, MD   Note: This note was generated in part or whole with voice recognition software. Voice recognition is usually quite accurate but there are transcription errors that can and very often do occur. I apologize for any typographical errors that were not detected and corrected.     Earleen Newport, MD 06/23/17 [phone]

## 2017-06-23 NOTE — ED Triage Notes (Signed)
Pt states diarrhea since Sunday, lower abdominal cramping, phenergan for nausea is helping, appears in NAD.

## 2017-06-23 NOTE — ED Notes (Signed)
Informed RN that patient has been roomed and is ready for evaluation.  Patient in NAD at this time and call bell placed within reach.   

## 2017-07-19 ENCOUNTER — Ambulatory Visit: Payer: Self-pay

## 2017-08-02 ENCOUNTER — Ambulatory Visit: Payer: Self-pay | Attending: Oncology | Admitting: *Deleted

## 2017-08-02 ENCOUNTER — Encounter (INDEPENDENT_AMBULATORY_CARE_PROVIDER_SITE_OTHER): Payer: Self-pay

## 2017-08-02 ENCOUNTER — Encounter: Payer: Self-pay | Admitting: *Deleted

## 2017-08-02 ENCOUNTER — Ambulatory Visit
Admission: RE | Admit: 2017-08-02 | Discharge: 2017-08-02 | Disposition: A | Discharge status: Home / Self Care | Payer: Self-pay | Source: Ambulatory Visit | Attending: Oncology | Admitting: Oncology

## 2017-08-02 VITALS — BP 121/84 | HR 85 | Temp 98.1°F | Ht 63.0 in | Wt 173.0 lb

## 2017-08-02 DIAGNOSIS — Z0000 Encounter for general adult medical examination without abnormal findings: Secondary | ICD-10-CM

## 2017-08-02 NOTE — Patient Instructions (Signed)
Gave patient hand-out, Women Staying Healthy, Active and Well from [facility], with education on breast health, pap smears, heart and colon health. 

## 2017-08-02 NOTE — Progress Notes (Signed)
  Subjective:     Patient ID: Amy Bell, female   DOB: 11/04/1955, 62 y.o.   MRN: [id number]  HPI   Review of Systems     Objective:   Physical Exam  Pulmonary/Chest: Right breast exhibits no inverted nipple, no mass, no nipple discharge, no skin change and no tenderness. Left breast exhibits inverted nipple. Left breast exhibits no mass, no nipple discharge, no skin change and no tenderness.  Left inverted nipple - normal per patient       Assessment:     62 year old white female returns to [facility] screening.  Clinical breast exam unremarkable.  Taught self breast awareness.  Patient with a total hysterectomy in 2004.  No pap per protocol.  Patient has been screened for eligibility.  She does not have any insurance, Medicare or Medicaid.  She also meets financial eligibility.  Hand-out given on the Affordable Care Act. Risk Assessment    Risk Scores      08/02/2017   Last edited by: Rico Junker, RN   5-year risk: 1.9 %   Lifetime risk: 8.1 %            Plan:     Screening mammogram ordered.  Will follow-up per [facility] protocol

## 2017-08-03 ENCOUNTER — Encounter: Payer: Self-pay | Admitting: *Deleted

## 2017-08-03 NOTE — Progress Notes (Signed)
Letter mailed from the [facility] to inform patient of her normal mammogram results.  Patient is to follow-up with annual screening in one year.  HSIS to [place]. 

## 2018-11-22 ENCOUNTER — Other Ambulatory Visit: Payer: Self-pay

## 2018-11-22 ENCOUNTER — Ambulatory Visit
Admission: EM | Admit: 2018-11-22 | Discharge: 2018-11-22 | Disposition: A | Discharge status: Home / Self Care | Payer: Medicare HMO | Attending: Family Medicine | Admitting: Family Medicine

## 2018-11-22 DIAGNOSIS — N3000 Acute cystitis without hematuria: Secondary | ICD-10-CM | POA: Insufficient documentation

## 2018-11-22 LAB — POCT URINALYSIS DIP (MANUAL ENTRY)
Bilirubin, UA: NEGATIVE
Blood, UA: NEGATIVE
Glucose, UA: 100 mg/dL — AB
Ketones, POC UA: NEGATIVE mg/dL
Leukocytes, UA: NEGATIVE
Nitrite, UA: POSITIVE — AB
Protein Ur, POC: NEGATIVE mg/dL
Spec Grav, UA: 1.025 (ref 1.010–1.025)
Urobilinogen, UA: 0.2 E.U./dL
pH, UA: 5 (ref 5.0–8.0)

## 2018-11-22 MED ORDER — CEPHALEXIN 500 MG PO CAPS: 500.0000 mg | ORAL_CAPSULE | Freq: Two times a day (BID) | ORAL | 0 refills | Status: AC

## 2018-11-22 MED ORDER — FLUCONAZOLE 150 MG PO TABS: 150.0000 mg | ORAL_TABLET | Freq: Every day | ORAL | 0 refills | Status: DC

## 2018-11-22 NOTE — ED Triage Notes (Signed)
Pt presents with dysuria and urinary frequency x 4 weeks. Patient was treated with Bactrim that she finished on Friday. Pt complaints of continued symptoms. States that her symptoms improved but never fully went away and are now completely back.

## 2018-11-22 NOTE — ED Provider Notes (Signed)
Amy Bell    CSN: [id number] Arrival date & time: 11/22/18  1121      History   Chief Complaint Chief Complaint  Patient presents with  . Recurrent UTI    HPI Amy Bell is a 63 y.o. female.   Pt is a 63 year old female that presents with continued urinary symptoms. She has had dysuria and urinary frequency x 4 weeks. Symptoms have been constant, waxing and waning.  She was treated with Bactrim from a previous urgent care and finished this medication approximately 5 days ago.  Her symptoms are not improved.  She is also been using AZO for her symptoms.  Denies any associate abdominal pain, back pain, fevers.  ROS per HPI      Past Medical History:  Diagnosis Date  . Cancer ([place]) 1980'S   CERVICAL CA  . GERD (gastroesophageal reflux disease)   . IBS (irritable bowel syndrome)   . Morton's neuroma    Right foot  . Osteoporosis   . Stomach ulcer     There are no active problems to display for this patient.   Past Surgical History:  Procedure Laterality Date  . BREAST BIOPSY Right 10+YRS AGO   CORE W/CLIP - NEG  . BREAST CYST ASPIRATION Bilateral    MULTIPLE    OB History   No obstetric history on file.      Home Medications    Prior to Admission medications   Medication Sig Start Date End Date Taking? Authorizing Provider  fluocinonide (LIDEX) 0.05 % external solution BID to scalp 01/03/15  Yes [provider]  oxybutynin (DITROPAN) 5 MG tablet Take 5 mg by mouth 3 (three) times daily.   Yes [provider]  pantoprazole (PROTONIX) 40 MG tablet Take 40 mg by mouth. 10/09/13  Yes [provider]  zolpidem (AMBIEN) 5 MG tablet Take 5 mg by mouth as needed.    Yes [provider]  cephALEXin (KEFLEX) 500 MG capsule Take 1 capsule (500 mg total) by mouth 2 (two) times daily for 7 days. 11/22/18 11/29/18  Loura Halt A, NP  fluconazole (DIFLUCAN) 150 MG tablet Take 1 tablet (150 mg total) by mouth daily.  11/22/18   Mihira Tozzi, Tressia Miners A, NP  fluticasone (FLONASE) 50 MCG/ACT nasal spray by Nasal route.    [provider]  hyoscyamine (LEVBID) 0.375 MG 12 hr tablet 0.375 mg. 09/10/14   [provider]  oxyCODONE-acetaminophen (PERCOCET) 5-325 MG tablet Take 1-2 tablets by mouth every 8 (eight) hours as needed. 06/23/17   Earleen Newport, MD    Family History Family History  Problem Relation Age of Onset  . Breast cancer Mother        44'S  . Healthy Father     Social History Social History   Tobacco Use  . Smoking status: Former Research scientist (life sciences)  . Smokeless tobacco: Never Used  Substance Use Topics  . Alcohol use: No  . Drug use: No     Allergies   Ondansetron hcl, Amoxicillin, Augmentin [amoxicillin-pot clavulanate], Levofloxacin, Nitrofurantoin, and Tape   Review of Systems Review of Systems   Physical Exam Triage Vital Signs ED Triage Vitals  Enc Vitals Group     BP 11/22/18 1142 138/76     Pulse Rate 11/22/18 1142 61     Resp 11/22/18 1142 18     Temp 11/22/18 1145 98.2 F (36.8 C)     Temp Source 11/22/18 1142 Oral     SpO2 11/22/18  1142 96 %     Weight --      Height --      Head Circumference --      Peak Flow --      Pain Score 11/22/18 1123 4     Pain Loc --      Pain Edu? --      Excl. in [place]? --    No data found.  Updated Vital Signs BP 138/76 (BP Location: Left Arm)   Pulse 61   Temp 98.2 F (36.8 C)   Resp 18   SpO2 96%   Visual Acuity Right Eye Distance:   Left Eye Distance:   Bilateral Distance:    Right Eye Near:   Left Eye Near:    Bilateral Near:     Physical Exam Vitals signs and nursing note reviewed.  Constitutional:      General: She is not in acute distress.    Appearance: Normal appearance. She is not ill-appearing, toxic-appearing or diaphoretic.  HENT:     Head: Normocephalic.     Nose: Nose normal.     Mouth/Throat:     Pharynx: Oropharynx is clear.  Eyes:     Conjunctiva/sclera: Conjunctivae normal.  Neck:      Musculoskeletal: Normal range of motion.  Pulmonary:     Effort: Pulmonary effort is normal.  Abdominal:     Palpations: Abdomen is soft.     Tenderness: There is no abdominal tenderness.  Musculoskeletal: Normal range of motion.  Skin:    General: Skin is warm and dry.     Findings: No rash.  Neurological:     Mental Status: She is alert.  Psychiatric:        Mood and Affect: Mood normal.      UC Treatments / Results  Labs (all labs ordered are listed, but only abnormal results are displayed) Labs Reviewed  POCT URINALYSIS DIP (MANUAL ENTRY) - Abnormal; Notable for the following components:      Result Value   Color, UA orange (*)    Glucose, UA =100 (*)    Nitrite, UA Positive (*)    All other components within normal limits  URINE CULTURE    EKG   Radiology No results found.  Procedures Procedures (including critical care time)  Medications Ordered in UC Medications - No data to display  Initial Impression / Assessment and Plan / UC Course  I have reviewed the triage vital signs and the nursing notes.  Pertinent labs & imaging results that were available during my care of the patient were reviewed by me and considered in my medical decision making (see chart for details).     UTI-urine today with positive nitrites. We will send for culture. Will retreat for UTI based on patient still having symptoms but never got better with previous treatment. Most likely resistance to Bactrim. Will treat with Keflex twice a day for 7 days. Recommended push fluids Culture pending Follow up as needed for continued or worsening symptoms  Final Clinical Impressions(s) / UC Diagnoses   Final diagnoses:  Acute cystitis with hematuria     Discharge Instructions     We will try a different antibiotic to see if this gets rid of the infection. Make sure you are drinking plenty of water We will send the urine for culture and call you if we need to make any changes.  I will also send 1 Diflucan to take at the end of your treatment  ED Prescriptions    Medication Sig Dispense Auth. Provider   cephALEXin (KEFLEX) 500 MG capsule Take 1 capsule (500 mg total) by mouth 2 (two) times daily for 7 days. 14 capsule Yitzchak Kothari A, NP   fluconazole (DIFLUCAN) 150 MG tablet Take 1 tablet (150 mg total) by mouth daily. 2 tablet Loura Halt A, NP     PDMP not reviewed this encounter.   Orvan July, NP 11/22/18 1324

## 2018-11-22 NOTE — Discharge Instructions (Signed)
We will try a different antibiotic to see if this gets rid of the infection. Make sure you are drinking plenty of water We will send the urine for culture and call you if we need to make any changes. I will also send 1 Diflucan to take at the end of your treatment

## 2018-11-24 LAB — URINE CULTURE: Culture: NO GROWTH

## 2018-12-13 IMAGING — CT CT MAXILLOFACIAL W/O CM
4 of 6 series · 16 of 47 positions shown, 18 images · non-contrast
Comparison: None.

CLINICAL DATA: Trip and fall 2 days ago with facial injury and
periorbital bruising

EXAM:
CT HEAD WITHOUT CONTRAST
CT MAXILLOFACIAL WITHOUT CONTRAST
TECHNIQUE: Multidetector CT imaging of the head and maxillofacial structures
were performed using the standard protocol without intravenous
contrast. Multiplanar CT image reconstructions of the maxillofacial
structures were also generated.

[Series 2: head wo · axial · 0.43mm/px · z∈[-80,+20]mm · 6 of 28 slices shown, 8 images]
[im 4/28  brain]
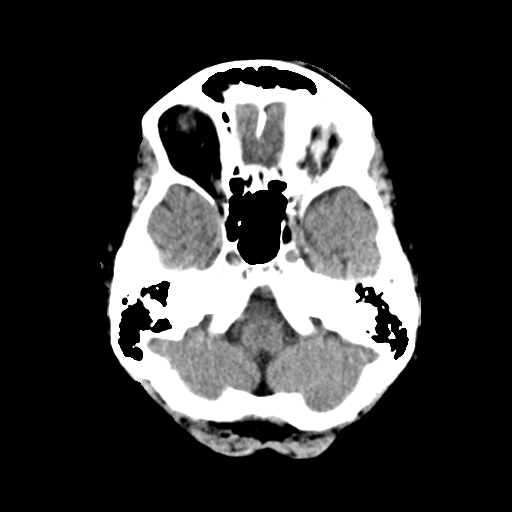
[im 4/28  bone]
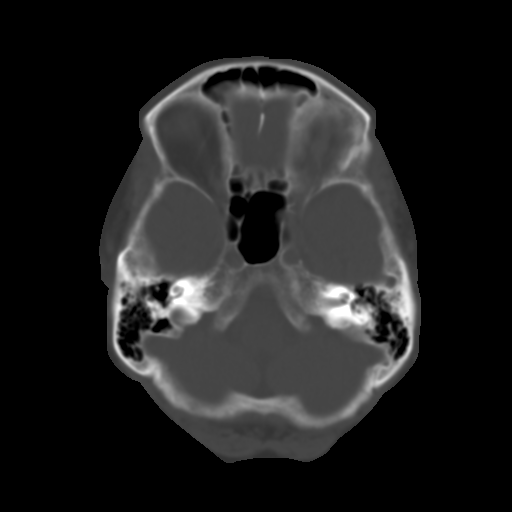
[im 8/28  bone]
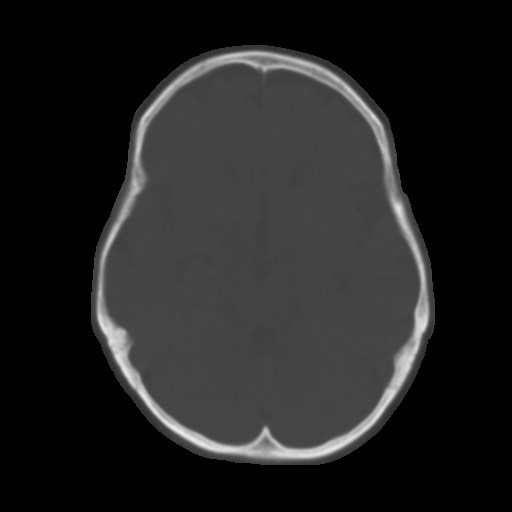
[im 12/28  bone]
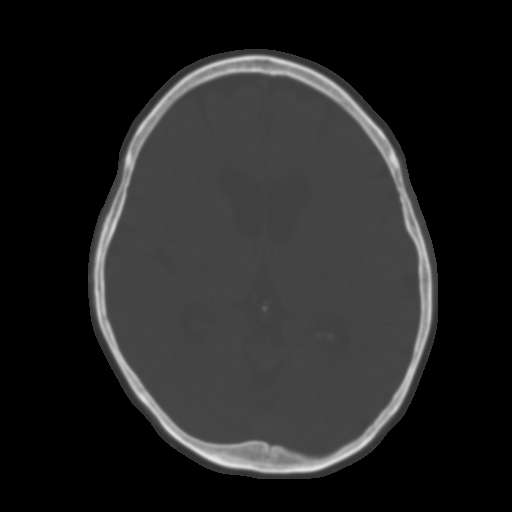
[im 16/28  bone]
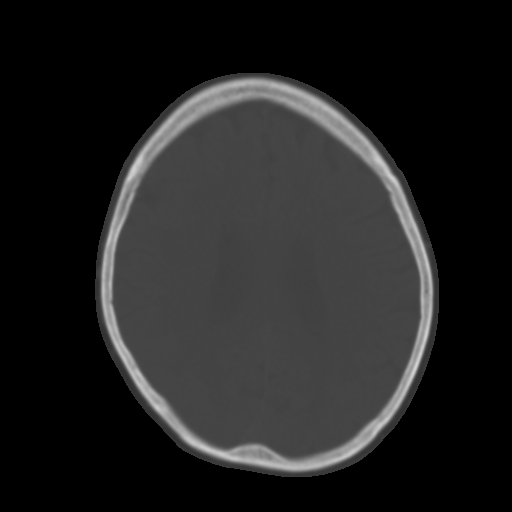
[im 20/28  brain]
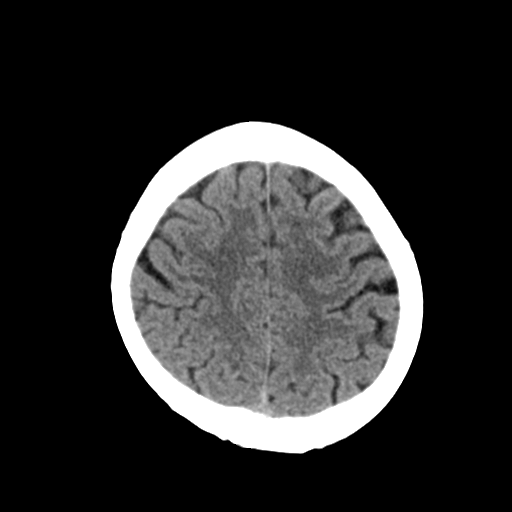
[im 20/28  bone]
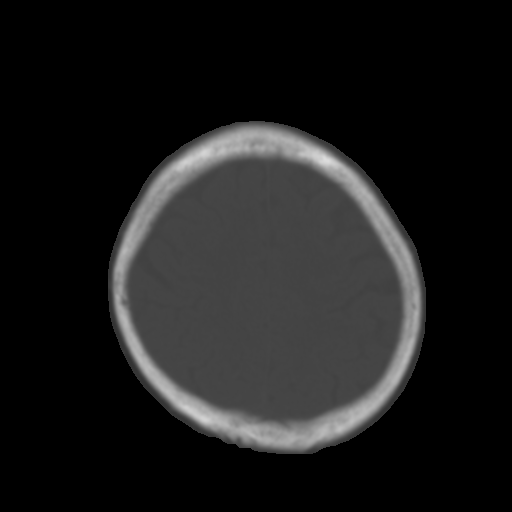
[im 24/28  bone]
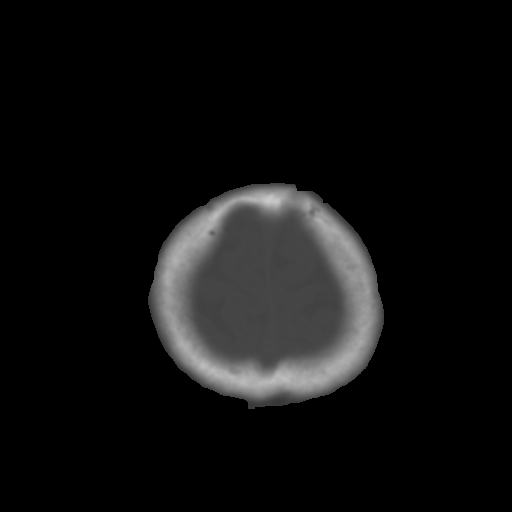

[Series 6: max soft · axial · 0.33mm/px · z∈[-201,-123]mm · 5 of 82 slices shown]
[im 8/82  brain]
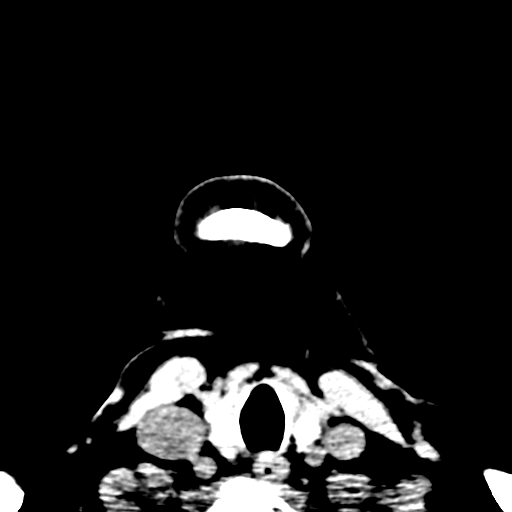
[im 16/82  brain]
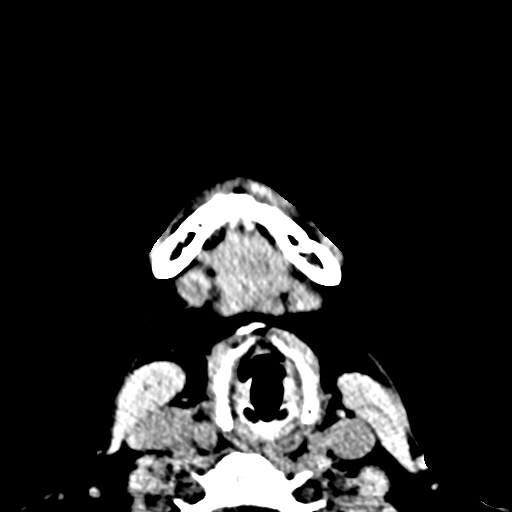
[im 28/82  brain]
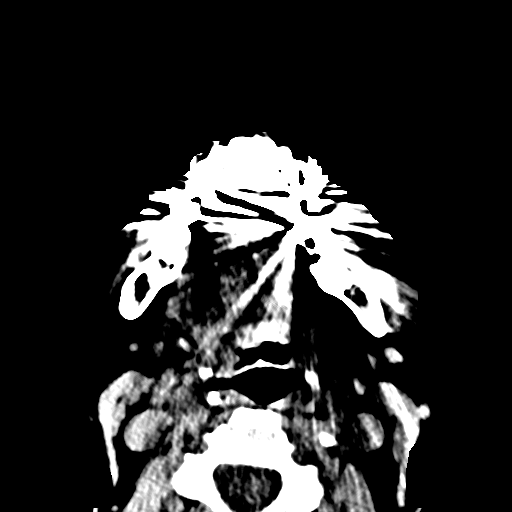
[im 35/82  brain]
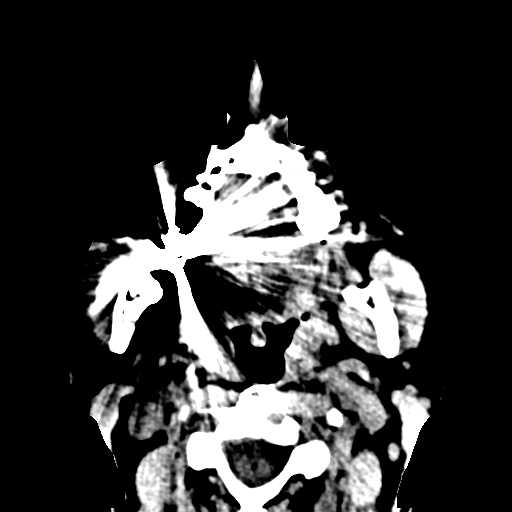
[im 47/82  brain]
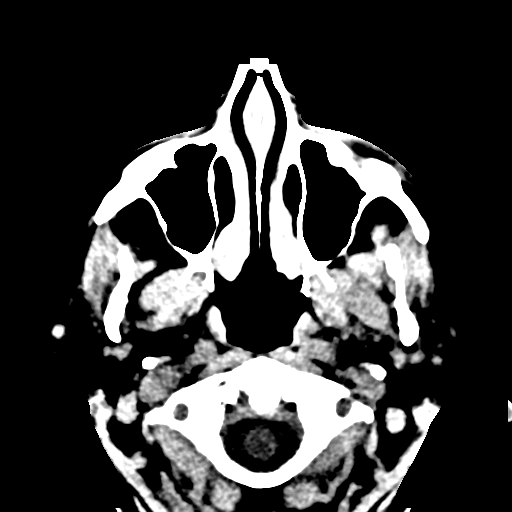

[Series 10: coronal soft · coronal · 0.32mm/px · 3 of 75 slices shown]
[im 23/75  bone]
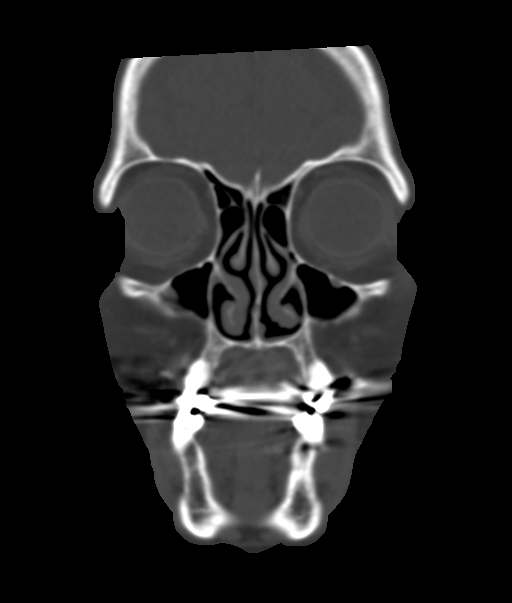
[im 36/75  bone]
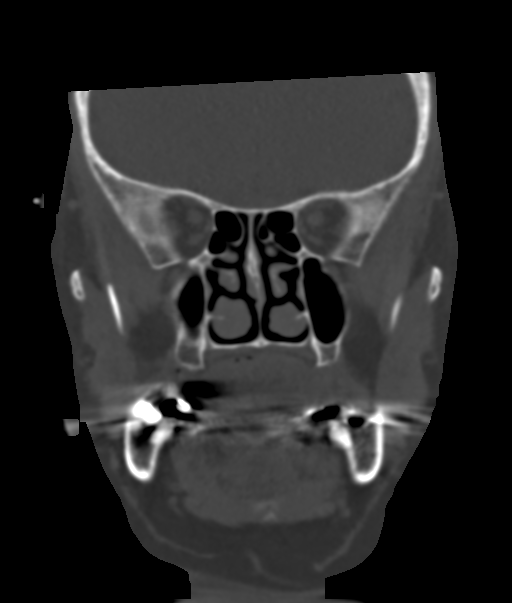
[im 49/75  bone]
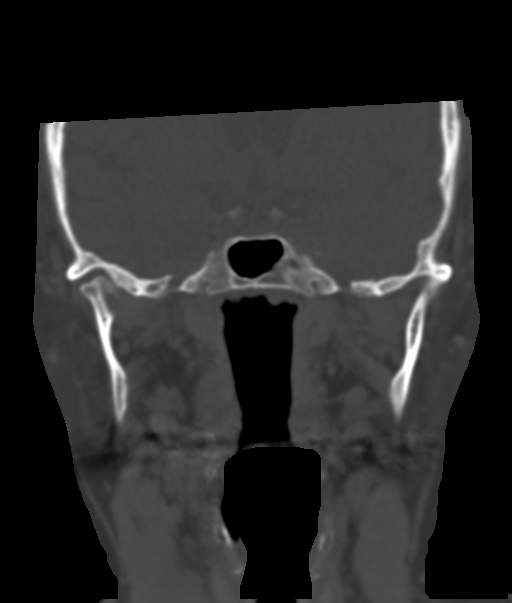

[Series 11: sagittal soft · sagittal · 0.34mm/px · 2 of 77 slices shown]
[im 26/77  bone]
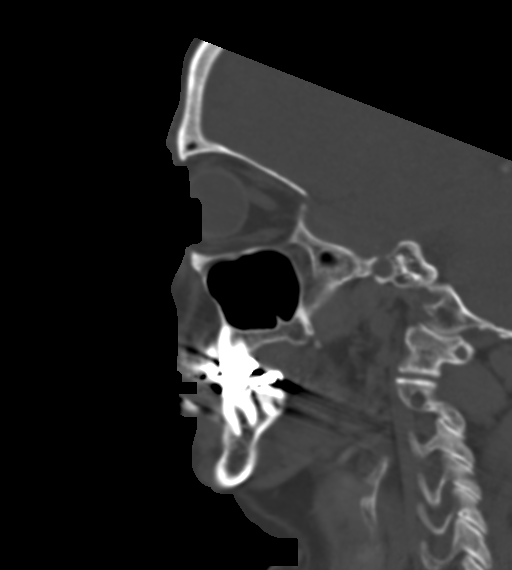
[im 51/77  bone]
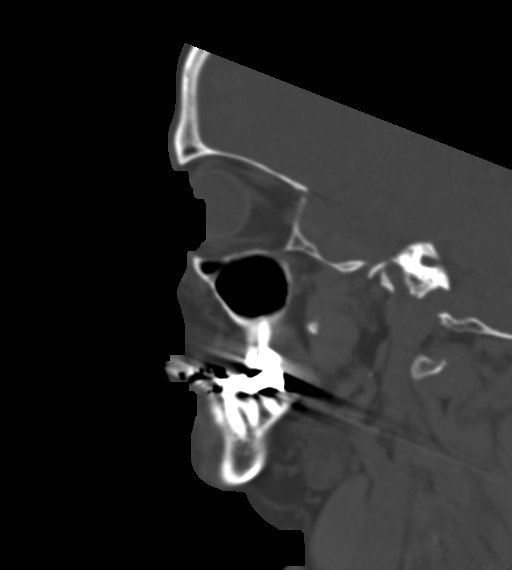

[16 of 47 positions shown; findings below may reference images not displayed]

FINDINGS: CT HEAD FINDINGS

Brain: Mild atrophic changes are noted. No findings to suggest acute
hemorrhage, acute infarction or space-occupying mass lesion are
noted.

Vascular: No hyperdense vessel or unexpected calcification.

Skull: Normal. Negative for fracture or focal lesion.

Other: None.

CT MAXILLOFACIAL FINDINGS

Osseous: No fracture or mandibular dislocation. No destructive
process. There is significant sclerosis of the C3 vertebral body
likely related to a bone island

Orbits: Negative. No traumatic or inflammatory finding.

Sinuses: Clear.

Soft tissues: Mild soft tissue swelling is noted about the left
orbit consistent with the recent injury.
IMPRESSION: CT of the head: Chronic atrophic changes without acute abnormality.

CT of the maxillofacial bones: No acute bony abnormality identified.

Likely bone island in the C3 vertebral body.

Mild soft tissue swelling near the left orbit.

## 2018-12-26 ENCOUNTER — Ambulatory Visit
Admission: EM | Admit: 2018-12-26 | Discharge: 2018-12-26 | Disposition: A | Discharge status: Home / Self Care | Payer: Medicare HMO | Attending: Family Medicine | Admitting: Family Medicine

## 2018-12-26 ENCOUNTER — Encounter: Payer: Self-pay | Admitting: *Deleted

## 2018-12-26 DIAGNOSIS — N3001 Acute cystitis with hematuria: Secondary | ICD-10-CM

## 2018-12-26 LAB — POCT URINALYSIS DIP (MANUAL ENTRY)
Bilirubin, UA: NEGATIVE
Glucose, UA: NEGATIVE mg/dL
Ketones, POC UA: NEGATIVE mg/dL
Nitrite, UA: NEGATIVE
Protein Ur, POC: NEGATIVE mg/dL
Spec Grav, UA: 1.015 (ref 1.010–1.025)
Urobilinogen, UA: 0.2 E.U./dL
pH, UA: 6.5 (ref 5.0–8.0)

## 2018-12-26 MED ORDER — FLUCONAZOLE 150 MG PO TABS: 150.0000 mg | ORAL_TABLET | Freq: Every day | ORAL | 0 refills | Status: AC

## 2018-12-26 MED ORDER — CEPHALEXIN 500 MG PO CAPS: 500.0000 mg | ORAL_CAPSULE | Freq: Two times a day (BID) | ORAL | 0 refills | Status: AC

## 2018-12-26 NOTE — ED Triage Notes (Signed)
Patient reports cloudy urine with dysuria with nausea. Started last week. Patient was seen on 11/3 for UTI, was given keflex, states she felt better after taking antibiotic.

## 2018-12-26 NOTE — ED Provider Notes (Signed)
Amy Bell    CSN: [id number] Arrival date & time: 12/26/18  1045      History   Chief Complaint Chief Complaint  Patient presents with  . Dysuria  . Polyuria    HPI RANASIA Bell is a 63 y.o. female.   Patient is a 63 year old female presents today with recurrent UTI.  Was here proximally 1 month ago for similar symptoms.  This episode started approximate 1 week ago.  She has had dysuria, cloudy urine and mild nausea.  Symptoms did resolve from last UTI.  Denies any associated back pain, fever, nausea.  She did have some mild chills last night.  Some mild lower abdominal pressure with urination and frequent urination.  ROS per HPI      Past Medical History:  Diagnosis Date  . Cancer ([place]) 1980'S   CERVICAL CA  . GERD (gastroesophageal reflux disease)   . IBS (irritable bowel syndrome)   . Morton's neuroma    Right foot  . Osteoporosis   . Stomach ulcer     There are no active problems to display for this patient.   Past Surgical History:  Procedure Laterality Date  . BREAST BIOPSY Right 10+YRS AGO   CORE W/CLIP - NEG  . BREAST CYST ASPIRATION Bilateral    MULTIPLE    OB History   No obstetric history on file.      Home Medications    Prior to Admission medications   Medication Sig Start Date End Date Taking? Authorizing Provider  promethazine (PHENERGAN) 12.5 MG tablet Take 12.5 mg by mouth as needed for nausea or vomiting.   Yes [provider]  cephALEXin (KEFLEX) 500 MG capsule Take 1 capsule (500 mg total) by mouth 2 (two) times daily for 7 days. 12/26/18 01/02/19  Loura Halt A, NP  fluconazole (DIFLUCAN) 150 MG tablet Take 1 tablet (150 mg total) by mouth daily. 12/26/18   Loura Halt A, NP  fluocinonide (LIDEX) 0.05 % external solution BID to scalp 01/03/15   [provider]  fluticasone (FLONASE) 50 MCG/ACT nasal spray by Nasal route.    [provider]  hyoscyamine (LEVBID) 0.375 MG 12 hr tablet 0.375  mg. 09/10/14   [provider]  oxybutynin (DITROPAN) 5 MG tablet Take 5 mg by mouth 3 (three) times daily.    [provider]  pantoprazole (PROTONIX) 40 MG tablet Take 40 mg by mouth. 10/09/13   [provider]  zolpidem (AMBIEN) 5 MG tablet Take 5 mg by mouth as needed.     [provider]    Family History Family History  Problem Relation Age of Onset  . Breast cancer Mother        40'S  . Healthy Father     Social History Social History   Tobacco Use  . Smoking status: Former Research scientist (life sciences)  . Smokeless tobacco: Never Used  Substance Use Topics  . Alcohol use: No  . Drug use: No     Allergies   Ondansetron hcl, Amoxicillin, Augmentin [amoxicillin-pot clavulanate], Levofloxacin, Nitrofurantoin, and Tape   Review of Systems Review of Systems   Physical Exam Triage Vital Signs ED Triage Vitals  Enc Vitals Group     BP 12/26/18 1100 (!) 143/91     Pulse Rate 12/26/18 1100 81     Resp 12/26/18 1100 18     Temp 12/26/18 1100 98.2 F (36.8 C)     Temp Source 12/26/18 1100 Oral     SpO2  12/26/18 1100 96 %     Weight --      Height --      Head Circumference --      Peak Flow --      Pain Score 12/26/18 1047 3     Pain Loc --      Pain Edu? --      Excl. in [place]? --    No data found.  Updated Vital Signs BP (!) 143/91   Pulse 81   Temp 98.2 F (36.8 C) (Oral)   Resp 18   SpO2 96%   Visual Acuity Right Eye Distance:   Left Eye Distance:   Bilateral Distance:    Right Eye Near:   Left Eye Near:    Bilateral Near:     Physical Exam Vitals signs and nursing note reviewed.  Constitutional:      General: She is not in acute distress.    Appearance: Normal appearance. She is not ill-appearing, toxic-appearing or diaphoretic.  HENT:     Head: Normocephalic.     Nose: Nose normal.     Mouth/Throat:     Pharynx: Oropharynx is clear.  Eyes:     Conjunctiva/sclera: Conjunctivae normal.  Neck:     Musculoskeletal: Normal  range of motion.  Pulmonary:     Effort: Pulmonary effort is normal.  Abdominal:     Palpations: Abdomen is soft.     Tenderness: There is no abdominal tenderness.  Musculoskeletal: Normal range of motion.  Skin:    General: Skin is warm and dry.     Findings: No rash.  Neurological:     Mental Status: She is alert.  Psychiatric:        Mood and Affect: Mood normal.      UC Treatments / Results  Labs (all labs ordered are listed, but only abnormal results are displayed) Labs Reviewed  POCT URINALYSIS DIP (MANUAL ENTRY) - Abnormal; Notable for the following components:      Result Value   Blood, UA small (*)    Leukocytes, UA Large (3+) (*)    All other components within normal limits  URINE CULTURE    EKG   Radiology No results found.  Procedures Procedures (including critical care time)  Medications Ordered in UC Medications - No data to display  Initial Impression / Assessment and Plan / UC Course  I have reviewed the triage vital signs and the nursing notes.  Pertinent labs & imaging results that were available during my care of the patient were reviewed by me and considered in my medical decision making (see chart for details).    Acute cystitis-patient with large leuks and small blood We will going to treat for urinary tract infection with Keflex. Urine sent for culture Recommended push fluids If this problem recurs again she will need to follow with a specialist.  Patient understanding and agree. Final Clinical Impressions(s) / UC Diagnoses   Final diagnoses:  Acute cystitis with hematuria     Discharge Instructions     Treating for urinary tract infection.  Make sure you take the medication as prescribed. Make sure you drink plenty of fluids and staying hydrated Follow up as needed for continued or worsening symptoms     ED Prescriptions    Medication Sig Dispense Auth. Provider   cephALEXin (KEFLEX) 500 MG capsule Take 1 capsule (500 mg  total) by mouth 2 (two) times daily for 7 days. 14 capsule Loura Halt A, NP  fluconazole (DIFLUCAN) 150 MG tablet Take 1 tablet (150 mg total) by mouth daily. 2 tablet Loura Halt A, NP     PDMP not reviewed this encounter.   Orvan July, NP 12/26/18 1122

## 2018-12-26 NOTE — Discharge Instructions (Addendum)
Treating for urinary tract infection.  Make sure you take the medication as prescribed. Make sure you drink plenty of fluids and staying hydrated Follow up as needed for continued or worsening symptoms

## 2018-12-28 LAB — URINE CULTURE: Culture: 50000 — AB

## 2019-01-05 ENCOUNTER — Ambulatory Visit
Admission: EM | Admit: 2019-01-05 | Discharge: 2019-01-05 | Disposition: A | Discharge status: Home / Self Care | Payer: Medicare HMO | Attending: Nurse Practitioner | Admitting: Nurse Practitioner

## 2019-01-05 ENCOUNTER — Other Ambulatory Visit: Payer: Self-pay

## 2019-01-05 ENCOUNTER — Encounter: Payer: Self-pay | Admitting: Emergency Medicine

## 2019-01-05 DIAGNOSIS — Z20828 Contact with and (suspected) exposure to other viral communicable diseases: Secondary | ICD-10-CM | POA: Diagnosis not present

## 2019-01-05 DIAGNOSIS — Z20822 Contact with and (suspected) exposure to covid-19: Secondary | ICD-10-CM

## 2019-01-05 NOTE — ED Triage Notes (Signed)
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

## 2019-01-05 NOTE — Discharge Instructions (Signed)
Stay in home isolation until you receive results of your COVID test. You will only be notified for positive results. You may go online to MyChart in the next few days and review your results. Please follow CDC guidelines that are attached.   Aldona Bar, FNP-C

## 2019-01-05 NOTE — ED Provider Notes (Signed)
Roderic Palau    CSN: [id number] Arrival date & time: 01/05/19  1622      History   Chief Complaint Chief Complaint  Patient presents with  . Covid exposure    HPI Amy Bell is a 63 y.o. female.   Subjective:   Amy Bell is a 63 y.o. female who presents for COVID testing as she has had known exposure to [place] at work. Date of exposure was 12/27/18. The patient currently denies any symptoms. Specifically, she denies any fevers, chills, sweats, body aches, sore throat, cough, shortness of breath, nausea, vomiting, diarrhea, dizziness, headache or change in taste/smell. High risk factors for COVID complications include: age greater than 13 years of age.  The following portions of the patient's history were reviewed and updated as appropriate: allergies, current medications, past family history, past medical history, past social history, past surgical history and problem list.       Past Medical History:  Diagnosis Date  . 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 GERD (gastroesophageal reflux disease)   . IBS (irritable bowel syndrome)   . Morton's neuroma    Right foot  . Osteoporosis   . Stomach ulcer     There are no problems to display for this patient.   Past Surgical History:  Procedure Laterality Date  . BREAST BIOPSY Right 10+YRS AGO   CORE W/CLIP - NEG  . BREAST CYST ASPIRATION Bilateral    MULTIPLE    OB History   No obstetric history on file.      Home Medications    Prior to Admission medications   Medication Sig Start Date End Date Taking? Authorizing Provider  fluconazole (DIFLUCAN) 150 MG tablet Take 1 tablet (150 mg total) by mouth daily. 12/26/18   Loura Halt A, NP  fluocinonide (LIDEX) 0.05 % external solution BID to scalp 01/03/15   [provider]  fluticasone (FLONASE) 50 MCG/ACT nasal spray by Nasal route.    [provider]  hyoscyamine (LEVBID) 0.375 MG 12 hr tablet 0.375 mg. 09/10/14   [provider]  oxybutynin (DITROPAN) 5 MG tablet Take 5 mg by mouth 3 (three) times daily.    [provider]  pantoprazole (PROTONIX) 40 MG tablet Take 40 mg by mouth. 10/09/13   [provider]  promethazine (PHENERGAN) 12.5 MG tablet Take 12.5 mg by mouth as needed for nausea or vomiting.    [provider]  zolpidem (AMBIEN) 5 MG tablet Take 5 mg by mouth as needed.     [provider]    Family History Family History  Problem Relation Age of Onset  . Breast cancer Mother        63'S  . Healthy Father     Social History Social History   Tobacco Use  . Smoking status: Former Research scientist (life sciences)  . Smokeless tobacco: Never Used  Substance Use Topics  . Alcohol use: No  . Drug use: No     Allergies   Ondansetron hcl, Amoxicillin, Augmentin [amoxicillin-pot clavulanate], Levofloxacin, Nitrofurantoin, and Tape   Review of Systems Review of Systems  Constitutional: Negative.   HENT: Negative.   Respiratory: Negative.   Cardiovascular: Negative.   Musculoskeletal: Negative.   Neurological: Negative.   All other systems reviewed and are negative.    Physical Exam Triage Vital Signs ED Triage Vitals  Enc Vitals Group     BP 01/05/19 1632 138/79     Pulse Rate 01/05/19 1632 85  Resp 01/05/19 1632 16     Temp 01/05/19 1632 98.1 F (36.7 C)     Temp src --      SpO2 01/05/19 1632 96 %     Weight 01/05/19 1630 170 lb (77.1 kg)     Height --      Head Circumference --      Peak Flow --      Pain Score --      Pain Loc --      Pain Edu? --      Excl. in [place]? --    No data found.  Updated Vital Signs BP 138/79   Pulse 85   Temp 98.1 F (36.7 C)   Resp 16   Wt 170 lb (77.1 kg)   SpO2 96%   BMI 30.11 kg/m   Visual Acuity Right Eye Distance:   Left Eye Distance:   Bilateral Distance:    Right Eye Near:   Left Eye Near:    Bilateral Near:     Physical Exam Vitals reviewed.  Constitutional:      Appearance: Normal  appearance. She is not toxic-appearing.  HENT:     Head: Normocephalic.  Cardiovascular:     Rate and Rhythm: Normal rate and regular rhythm.  Pulmonary:     Effort: Pulmonary effort is normal.     Breath sounds: Normal breath sounds.  Musculoskeletal:        General: Normal range of motion.     Cervical back: Normal range of motion and neck supple.  Neurological:     General: No focal deficit present.     Mental Status: She is alert and oriented to person, place, and time.  Psychiatric:        Mood and Affect: Mood normal.      UC Treatments / Results  Labs (all labs ordered are listed, but only abnormal results are displayed) Labs Reviewed  NOVEL CORONAVIRUS, NAA    EKG   Radiology No results found.  Procedures Procedures (including critical care time)  Medications Ordered in UC Medications - No data to display  Initial Impression / Assessment and Plan / UC Course  I have reviewed the triage vital signs and the nursing notes.  Pertinent labs & imaging results that were available during my care of the patient were reviewed by me and considered in my medical decision making (see chart for details).    63 year old female presenting for Covid testing due to known exposure to Covid approximately 9 days ago.  Patient remains asymptomatic.  Afebrile.  Nontoxic-appearing.  PCR Covid test pending.  Patient advised to stay in isolation for 14 days from the day of exposure.  Further recommendations pending results of testing.   Today's evaluation has revealed no signs of a dangerous process. Discussed diagnosis with patient and/or guardian. Patient and/or guardian aware of their diagnosis, possible red flag symptoms to watch out for and need for close follow up. Patient and/or guardian understands verbal and written discharge instructions. Patient and/or guardian comfortable with plan and disposition.  Patient and/or guardian has a clear mental status at this time, good insight  into illness (after discussion and teaching) and has clear judgment to make decisions regarding their care  This care was provided during an unprecedented National Emergency due to the Novel Coronavirus (COVID-19) pandemic. COVID-19 infections and transmission risks place heavy strains on healthcare resources.  As this pandemic evolves, our facility, providers, and staff strive to respond fluidly, to remain operational,  and to provide care relative to available resources and information. Outcomes are unpredictable and treatments are without well-defined guidelines. Further, the impact of COVID-19 on all aspects of urgent care, including the impact to patients seeking care for reasons other than COVID-19, is unavoidable during this national emergency. At this time of the global pandemic, management of patients has significantly changed, even for non-COVID positive patients given high local and regional COVID volumes at this time requiring high healthcare system and resource utilization. The standard of care for management of both COVID suspected and non-COVID suspected patients continues to change rapidly at the local, regional, national, and global levels. This patient was worked up and treated to the best available but ever changing evidence and resources available at this current time.   Documentation was completed with the aid of voice recognition software. Transcription may contain typographical errors. Final Clinical Impressions(s) / UC Diagnoses   Final diagnoses:  Exposure to SARS-associated coronavirus  Encounter for screening laboratory testing for COVID-19 virus in asymptomatic patient     Discharge Instructions     Stay in home isolation until you receive results of your COVID test. You will only be notified for positive results. You may go online to MyChart in the next few days and review your results. Please follow CDC guidelines that are attached.   Aldona Bar, FNP-C      ED  Prescriptions    None     PDMP not reviewed this encounter.   Enrique Sack, [place] 01/05/19 1728

## 2019-01-07 LAB — NOVEL CORONAVIRUS, NAA: SARS-CoV-2, NAA: NOT DETECTED

## 2019-01-10 ENCOUNTER — Telehealth: Payer: Self-pay | Admitting: Emergency Medicine

## 2019-01-10 NOTE — Telephone Encounter (Signed)
Pt called and left VM stating she was still having symptoms and may need urology follow up. Called pt back and left VM stating she is welcome to return here for recheck but also would need follow up with PCP and possibly urology. Left VM to return call if any further questions.

## 2019-05-16 ENCOUNTER — Other Ambulatory Visit: Payer: Self-pay | Admitting: Family Medicine

## 2019-05-16 DIAGNOSIS — Z1231 Encounter for screening mammogram for malignant neoplasm of breast: Secondary | ICD-10-CM

## 2019-05-16 DIAGNOSIS — M810 Age-related osteoporosis without current pathological fracture: Secondary | ICD-10-CM

## 2019-09-07 DIAGNOSIS — M7062 Trochanteric bursitis, left hip: Secondary | ICD-10-CM | POA: Insufficient documentation

## 2020-01-16 IMAGING — CT CT ABD-PELV W/ CM
2 of 5 series · 16 of 46 positions shown, 18 images · IV contrast (omnipaque)
Comparison: None

CLINICAL DATA: Abdominal pain, lower abdominal cramping, nausea,
and diarrhea since [REDACTED] question diverticulitis

EXAM:
CT ABDOMEN AND PELVIS WITH CONTRAST
TECHNIQUE: Multidetector CT imaging of the abdomen and pelvis was performed
using the standard protocol following bolus administration of
intravenous contrast. Sagittal and coronal MPR images reconstructed
from axial data set.
CONTRAST:  100mL OMNIPAQUE IOHEXOL 300 MG/ML SOLN IV. No oral
contrast administered.

[Series 2: routine abd/pel with · axial · 0.62mm/px · z∈[-1046,-646]mm · 13 of 90 slices shown, 15 images]
[im 5/90  soft-tissue]
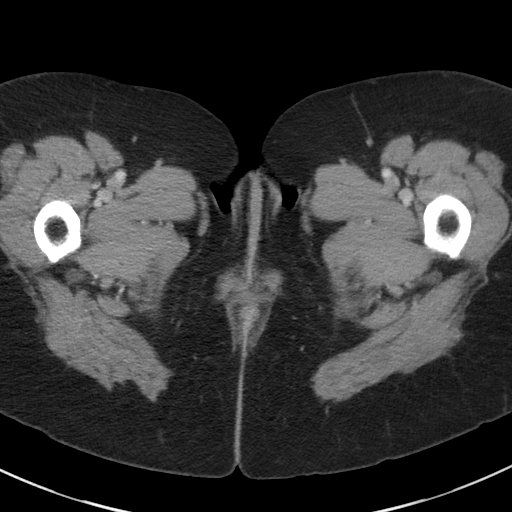
[im 5/90  bone]
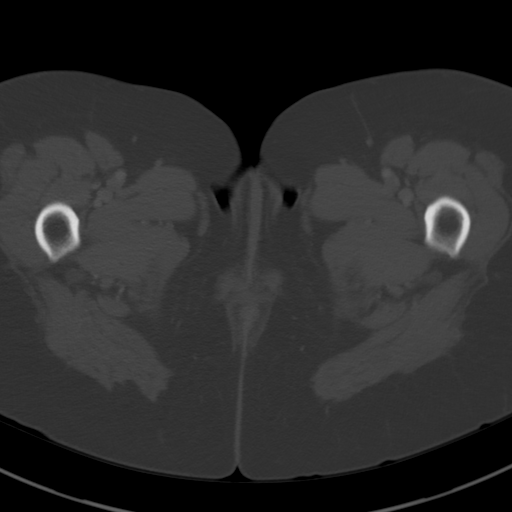
[im 15/90  soft-tissue]
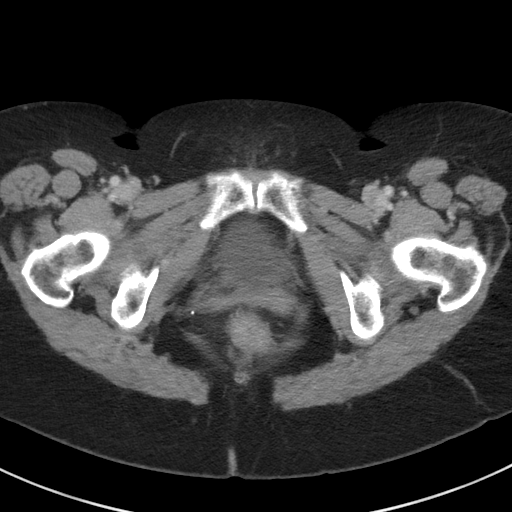
[im 19/90  soft-tissue]
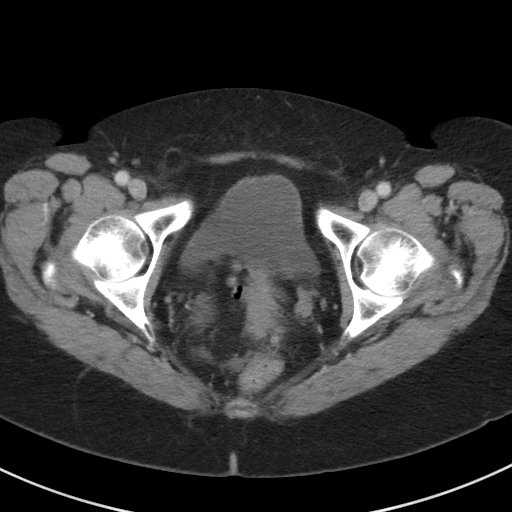
[im 24/90  soft-tissue]
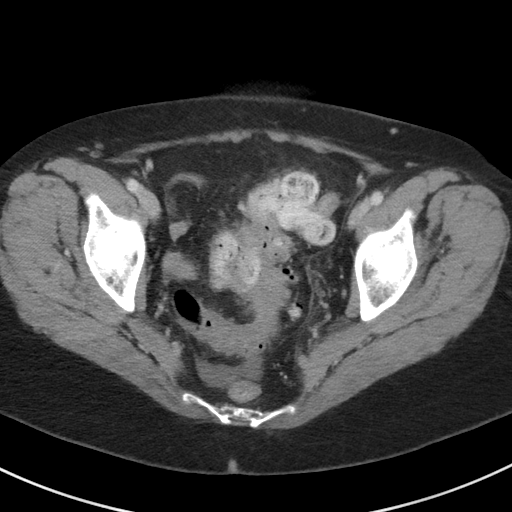
[im 33/90  soft-tissue]
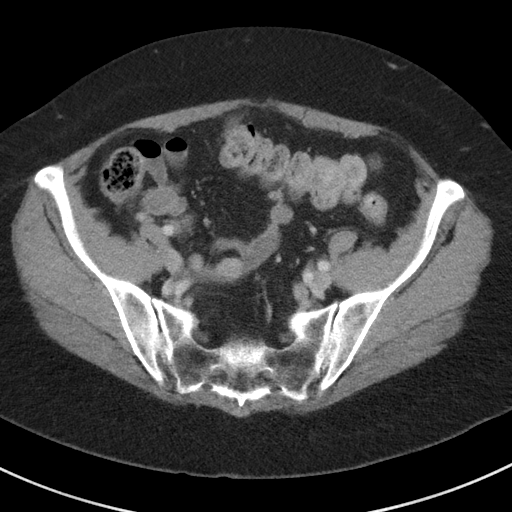
[im 38/90  soft-tissue]
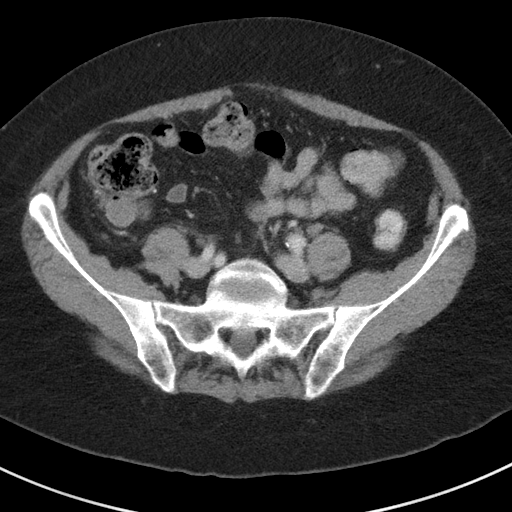
[im 47/90  soft-tissue]
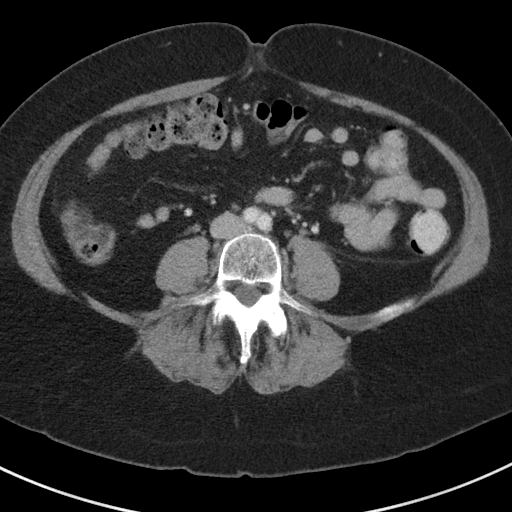
[im 52/90  soft-tissue]
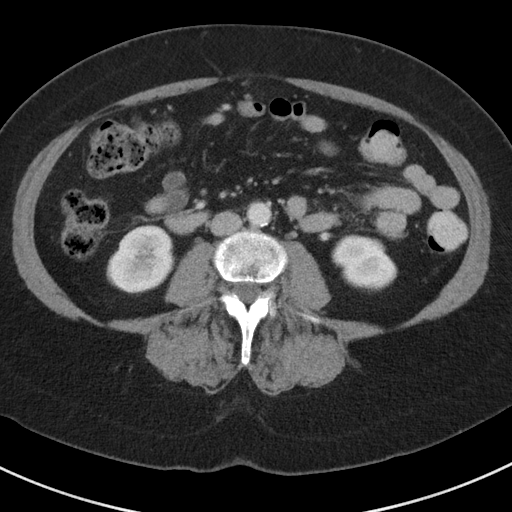
[im 57/90  soft-tissue]
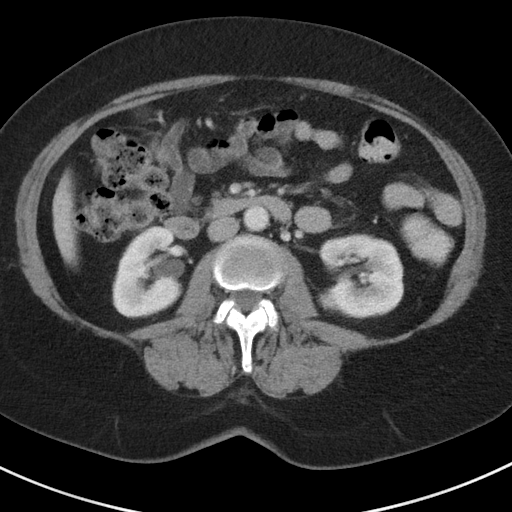
[im 57/90  bone]
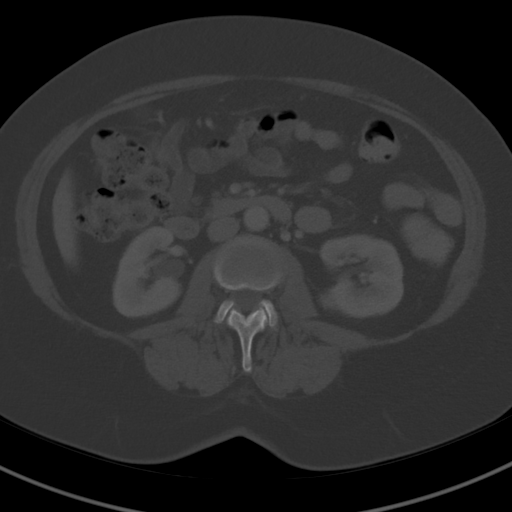
[im 66/90  soft-tissue]
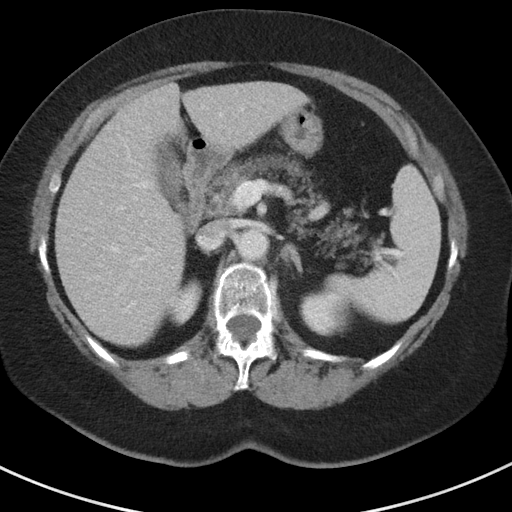
[im 71/90  soft-tissue]
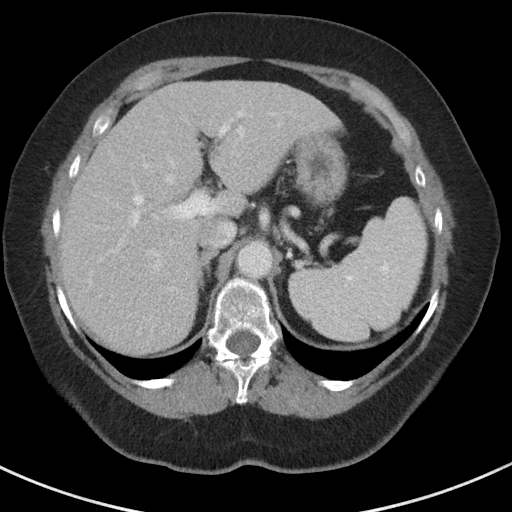
[im 75/90  soft-tissue]
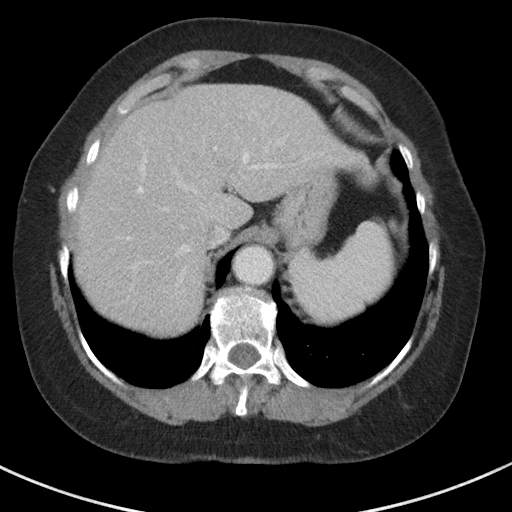
[im 85/90  soft-tissue]
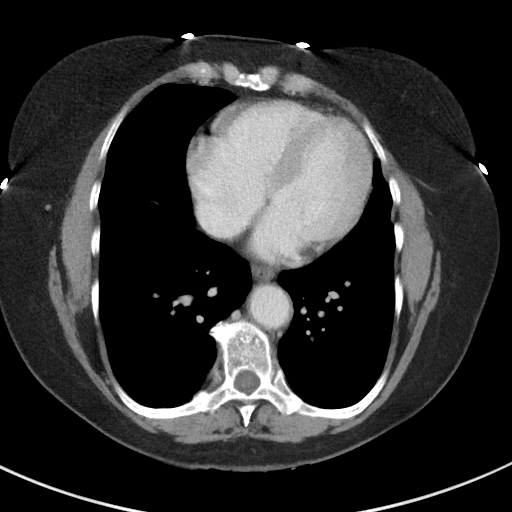

[Series 5: coronal st · coronal · 0.70mm/px · 3 of 92 slices shown]
[im 31/92  soft-tissue]
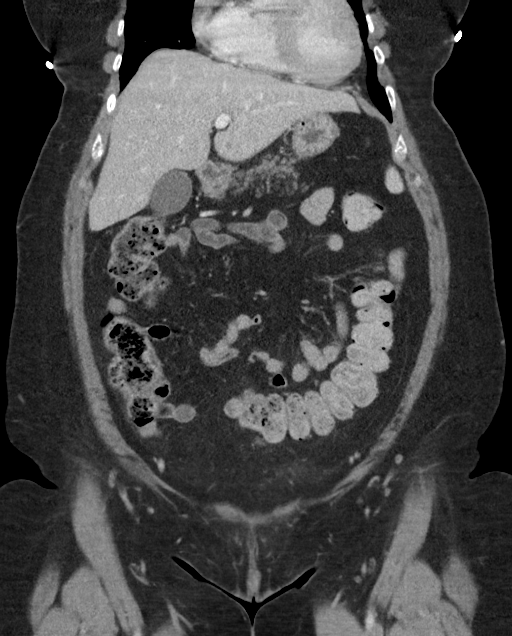
[im 41/92  soft-tissue]
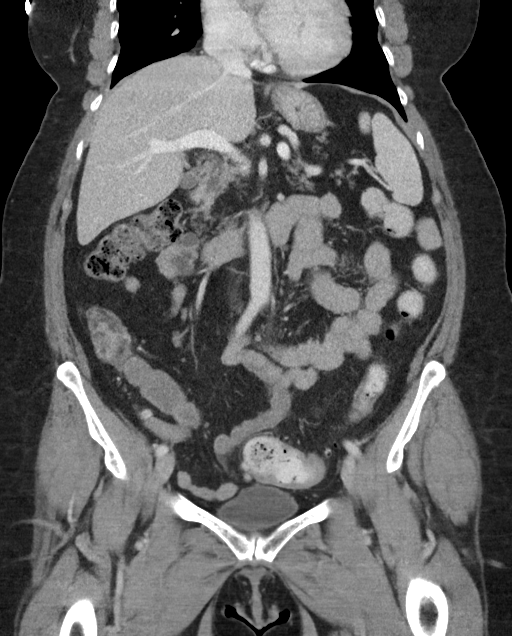
[im 51/92  soft-tissue]
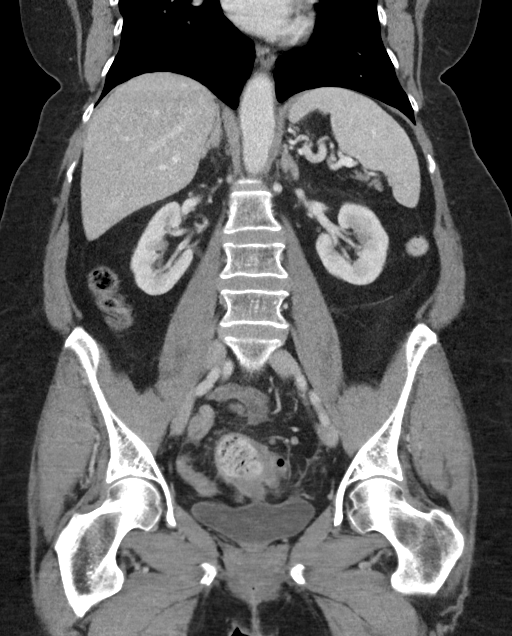

[16 of 46 positions shown; findings below may reference images not displayed]

FINDINGS: Lower chest: Lung bases clear

Hepatobiliary: Nonspecific low-attenuation lesion posteriorly in
lateral segment LEFT lobe liver 7 x 6 mm image 21. Gallbladder and
remainder of liver unremarkable.

Pancreas: Normal appearance

Spleen: Normal appearing

Adrenals/Urinary Tract: Adrenal glands normal appearance. Tiny
nonobstructing calculus at upper pole LEFT kidney. Tiny nonspecific
low-attenuation lesion in RIGHT kidney 6 mm diameter. Kidneys,
ureters, and bladder otherwise unremarkable.

Stomach/Bowel: Normal appendix. Sigmoid diverticulosis. Sigmoid
colonic wall thickening and pericolic infiltrative changes
consistent with acute diverticulitis. Infiltration of sigmoid
mesocolon. No definite abscess collection or extraluminal gas. No
evidence of obstruction. Questionable minimal mesenteric edema
associated with a small bowel loop in the LEFT mid abdomen,
nonspecific. Stomach and remaining bowel loops unremarkable

Vascular/Lymphatic: Atherosclerotic calcifications aorta and common
iliac arteries without aneurysm. No adenopathy.

Reproductive: Uterus surgically absent. Nonvisualization of ovaries.

Other: Small amount of free pelvic fluid. Tiny umbilical hernia
containing fat. No free intraperitoneal air.

Musculoskeletal: Bones unremarkable
IMPRESSION: Sigmoid diverticulitis without abscess or perforation.

Small amount of nonspecific free pelvic fluid.

Tiny nonobstructing LEFT upper pole renal calculus.

Nonspecific 7 mm low-attenuation lesion RIGHT lobe liver.

Tiny umbilical hernia containing fat.

## 2020-07-03 ENCOUNTER — Other Ambulatory Visit: Payer: Self-pay

## 2020-07-03 ENCOUNTER — Ambulatory Visit: Payer: Medicare HMO | Admitting: Podiatry

## 2020-07-03 ENCOUNTER — Encounter: Payer: Self-pay | Admitting: Podiatry

## 2020-07-03 ENCOUNTER — Encounter: Payer: Self-pay | Admitting: *Deleted

## 2020-07-03 DIAGNOSIS — M1990 Unspecified osteoarthritis, unspecified site: Secondary | ICD-10-CM | POA: Insufficient documentation

## 2020-07-03 DIAGNOSIS — G43909 Migraine, unspecified, not intractable, without status migrainosus: Secondary | ICD-10-CM | POA: Insufficient documentation

## 2020-07-03 DIAGNOSIS — G5761 Lesion of plantar nerve, right lower limb: Secondary | ICD-10-CM | POA: Diagnosis not present

## 2020-07-03 DIAGNOSIS — C4490 Unspecified malignant neoplasm of skin, unspecified: Secondary | ICD-10-CM | POA: Insufficient documentation

## 2020-07-03 DIAGNOSIS — G5760 Lesion of plantar nerve, unspecified lower limb: Secondary | ICD-10-CM

## 2020-07-03 DIAGNOSIS — M722 Plantar fascial fibromatosis: Secondary | ICD-10-CM

## 2020-07-03 MED ORDER — TRIAMCINOLONE ACETONIDE 40 MG/ML IJ SUSP
40.0000 mg | Freq: Once | INTRAMUSCULAR | Status: AC
  Administered 2020-07-03: 40 mg

## 2020-07-03 MED ORDER — DICLOFENAC SODIUM 75 MG PO TBEC: 75.0000 mg | DELAYED_RELEASE_TABLET | Freq: Two times a day (BID) | ORAL | 3 refills | Status: AC

## 2020-07-03 MED ORDER — DEXAMETHASONE SODIUM PHOSPHATE 120 MG/30ML IJ SOLN
2.0000 mg | Freq: Once | INTRAMUSCULAR | Status: AC
  Administered 2020-07-03: 2 mg via INTRA_ARTICULAR

## 2020-07-03 NOTE — Progress Notes (Signed)
Subjective:  Patient ID: Amy Bell, female    DOB: 1955-06-04,  MRN: [id number] HPI Chief Complaint  Patient presents with   Foot Pain    Plantar heel bilateral - aching x few months (L>R), treated in the past (2017), tried ice - xrays done a month ago at [facility] right - neuroma treated years ago, removed one in left, now right foot started hurting, tried ice    New Patient (Initial Visit)    Est pt 2017    65 y.o. female presents with the above complaint.   ROS: Denies fever chills nausea vomiting muscle aches pains calf pain back pain chest pain shortness of breath.  States that she went to urgent care just the other day who evaluated her feet put her out of work for a few days and wrote her prescription for Voltaren oral anti-inflammatory.  Past Medical History:  Diagnosis Date   Cancer ([place]) 1980'S   CERVICAL CA   GERD (gastroesophageal reflux disease)    IBS (irritable bowel syndrome)    Morton's neuroma    Right foot   Osteoporosis    Stomach ulcer    Past Surgical History:  Procedure Laterality Date   BREAST BIOPSY Right 10+YRS AGO   CORE W/CLIP - NEG   BREAST CYST ASPIRATION Bilateral    MULTIPLE    Current Outpatient Medications:    diclofenac (VOLTAREN) 75 MG EC tablet, Take 1 tablet (75 mg total) by mouth 2 (two) times daily., Disp: 60 tablet, Rfl: 3   amLODipine (NORVASC) 2.5 MG tablet, Take 2.5 mg by mouth daily., Disp: , Rfl:    fluconazole (DIFLUCAN) 150 MG tablet, Take 1 tablet (150 mg total) by mouth daily., Disp: 2 tablet, Rfl: 0   fluocinonide (LIDEX) 0.05 % external solution, BID to scalp, Disp: , Rfl:    fluticasone (FLONASE) 50 MCG/ACT nasal spray, by Nasal route., Disp: , Rfl:    hydrochlorothiazide (MICROZIDE) 12.5 MG capsule, hydrochlorothiazide 12.5 mg capsule, Disp: , Rfl:    hyoscyamine (LEVBID) 0.375 MG 12 hr tablet, 0.375 mg., Disp: , Rfl:    lisinopril (ZESTRIL) 5 MG tablet, Take 5 mg by mouth daily.,  Disp: , Rfl:    neomycin-polymyxin-dexamethasone (MAXITROL) 0.1 % ophthalmic suspension, neomycin-polymyxin-dexameth 3.5 mg/mL-10,000 unit/mL-0.1% eye drops  INSTILL 1 DROP INTO RIGHT EYE 4 TIMES A DAY, Disp: , Rfl:    oxybutynin (DITROPAN) 5 MG tablet, Take 5 mg by mouth 3 (three) times daily., Disp: , Rfl:    pantoprazole (PROTONIX) 40 MG tablet, Take 40 mg by mouth., Disp: , Rfl:    promethazine (PHENERGAN) 25 MG tablet, Take 25 mg by mouth every 6 (six) hours as needed., Disp: , Rfl:    sulfamethoxazole-trimethoprim (BACTRIM DS) 800-160 MG tablet, sulfamethoxazole 800 mg-trimethoprim 160 mg tablet  TAKE 1 TABLET BY MOUTH TWICE A DAY FOR 5 DAYS, Disp: , Rfl:    zolpidem (AMBIEN) 5 MG tablet, Take 5 mg by mouth as needed. , Disp: , Rfl:   Allergies  Allergen Reactions   Ondansetron Hcl Other (See Comments)    Severe Burning of skin from Hand to Genital Area.   Erythromycin Diarrhea   Amoxicillin Diarrhea   Augmentin [Amoxicillin-Pot Clavulanate] Nausea And Vomiting   Levofloxacin Other (See Comments)    Patient states it may her dizzy and blood sugars dropped   Nitrofurantoin Nausea Only   Tape Rash   Review of Systems Objective:  There were no vitals  filed for this visit.  General: Well developed, nourished, in no acute distress, alert and oriented x3   Dermatological: Skin is warm, dry and supple bilateral. Nails x 10 are well maintained; remaining integument appears unremarkable at this time. There are no open sores, no preulcerative lesions, no rash or signs of infection present.  Vascular: Dorsalis Pedis artery and Posterior Tibial artery pedal pulses are 2/4 bilateral with immedate capillary fill time. Pedal hair growth present. No varicosities and no lower extremity edema present bilateral.   Neruologic: Grossly intact via light touch bilateral. Vibratory intact via tuning fork bilateral. Protective threshold with Semmes Wienstein monofilament intact to all pedal sites  bilateral. Patellar and Achilles deep tendon reflexes 2+ bilateral. No Babinski or clonus noted bilateral.  Palpable Mulder's click second interdigital space of the right foot  Musculoskeletal: No gross boney pedal deformities bilateral. No pain, crepitus, or limitation noted with foot and ankle range of motion bilateral. Muscular strength 5/5 in all groups tested bilateral.  Pain on palpation medial calcaneal tubercles bilateral.  No pain on medial lateral compression of the calcaneus no pain on direct palpation of the gastrosoleus complex.  Gait: Unassisted, Nonantalgic.    Radiographs:  Radiographs were taken at [facility] and reviewed once again today.  Assessment & Plan:   Assessment: Planter fasciitis bilateral left greater than right with neuroma second interdigital space right  Plan: Discussed etiology pathology and surgical therapies at this point time went ahead and injected the bilateral heels today with 20 mg Kenalog 5 mg Marcaine point maximal tenderness.  Tolerated procedure well without complications.  Went ahead and injected the second interspace with 2 mg of dexamethasone and local anesthetic.  Tolerated the procedure well discussed appropriate shoe gear stretching size ice therapy shoe gear modifications and I would like to follow-up with Amy Bell in about 4 to 6 weeks.  Also wrote her a note to go back to work next Tuesday.     Amy Bell, [place]

## 2020-07-08 ENCOUNTER — Encounter: Payer: Self-pay | Admitting: *Deleted

## 2020-07-15 ENCOUNTER — Other Ambulatory Visit: Payer: Self-pay

## 2020-07-15 ENCOUNTER — Ambulatory Visit (INDEPENDENT_AMBULATORY_CARE_PROVIDER_SITE_OTHER): Payer: Medicare HMO

## 2020-07-15 ENCOUNTER — Encounter: Payer: Self-pay | Admitting: Podiatry

## 2020-07-15 ENCOUNTER — Ambulatory Visit: Payer: Medicare HMO | Admitting: Podiatry

## 2020-07-15 DIAGNOSIS — S93401A Sprain of unspecified ligament of right ankle, initial encounter: Secondary | ICD-10-CM | POA: Diagnosis not present

## 2020-07-15 NOTE — Progress Notes (Signed)
  Subjective:  Patient ID: Amy Bell, female    DOB: 05/28/1955,  MRN: [id number]  Chief Complaint  Patient presents with   Ankle Injury     (xrays)pt fell over the weekend and twisted right ankle    65 y.o. female presents with the above complaint. History confirmed with patient.  She fell down 2 stairs.  Has had difficulty putting weight on the ankle.  Very painful.  She recently got over plantar fasciitis.  Also hurt her knee.  Objective:  Physical Exam: warm, good capillary refill, no trophic changes or ulcerative lesions, normal DP and PT pulses, and normal sensory exam. Right Foot: Tenderness and edema over the ATFL and CFL, none of the medial complex.  None of the syndesmosis, no pain of the proximal fibula, no gross instability, no ecchymosis in the ankle    Radiographs: Multiple views x-ray of left ankle: no fracture, dislocation, mild soft tissue swelling over the lateral ankle Assessment:   1. Sprain of right ankle, unspecified ligament, initial encounter      Plan:  Patient was evaluated and treated and all questions answered.  Reviewed radiographs with patient and that she has a moderate to severe ankle sprain.  I think she should remain out of work as she has a demanding job on her feet for another 2 weeks.  CAM boot dispensed.  RICE protocol reviewed.  Recommended ice and NSAIDs.  Work note written.  Return in 2 weeks.  No follow-ups on file.

## 2020-07-29 ENCOUNTER — Encounter: Payer: Self-pay | Admitting: Podiatry

## 2020-07-29 ENCOUNTER — Other Ambulatory Visit: Payer: Self-pay

## 2020-07-29 ENCOUNTER — Ambulatory Visit: Payer: Medicare HMO | Admitting: Podiatry

## 2020-07-29 DIAGNOSIS — S93401A Sprain of unspecified ligament of right ankle, initial encounter: Secondary | ICD-10-CM

## 2020-07-29 NOTE — Progress Notes (Signed)
  Subjective:  Patient ID: Amy Bell, female    DOB: 1955/08/19,  MRN: [id number]  Chief Complaint  Patient presents with   Ankle Injury    2 week follow up ankle sprain right    65 y.o. female returns for follow-up with the above complaint. History confirmed with patient.  Has had some improvement.  She has not been able to wear the boot much because it hurts left knee. Compression sleeve helps  Objective:  Physical Exam: warm, good capillary refill, no trophic changes or ulcerative lesions, normal DP and PT pulses, and normal sensory exam. Right Foot: still with pain over lateral ligamentous complex, no gross instability    Radiographs: Multiple views x-ray of left ankle: no fracture, dislocation, mild soft tissue swelling over the lateral ankle Assessment:   1. Sprain of right ankle, unspecified ligament, initial encounter      Plan:  Patient was evaluated and treated and all questions answered.  Has had some improvement but not 100%.  Continue wearing compression sleeve.  Begin rehab exercises which I dispensed.  We will follow-up in 3 weeks and check this and her plantar fasciitis  Return in 23 days (on 08/21/2020).

## 2020-07-29 NOTE — Patient Instructions (Signed)
Ankle Sprain   An ankle sprain is a stretch or tear in a ligament in the ankle. Ligaments are tissues that connect bones to each other. The two most common types of ankle sprains are: Inversion sprain. This happens when the foot turns inward and the ankle rolls outward. It affects the ligament on the outside of the foot (lateral ligament). Eversion sprain. This happens when the foot turns outward and the ankle rolls inward. It affects the ligament on the inner side of the foot (medial ligament). What are the causes? This condition is often caused by accidentally rolling or twisting the ankle. What increases the risk? You are more likely to develop this condition if you play sports. What are the signs or symptoms?  Symptoms of this condition include: Pain in your ankle. Swelling. Bruising. This may develop right after you sprain your ankle or 1-2 days later. Trouble standing or walking, especially when you turn or change directions. How is this diagnosed? This condition is diagnosed with: A physical exam. During the exam, your health care provider will press on certain parts of your foot and ankle and try to move them in certain ways. X-ray imaging. These may be taken to see how severe the sprain is and to check for broken bones. How is this treated? This condition may be treated with: A brace or splint. This is used to keep the ankle from moving until it heals. An elastic bandage. This is used to support the ankle. Crutches. Pain medicine. Surgery. This may be needed if the sprain is severe. Physical therapy. This may help to improve the range of motion in the ankle. Follow these instructions at home: If you have a brace or a splint: Wear the brace or splint as told by your health care provider. Remove it only as told by your health care provider. Loosen the brace or splint if your toes tingle, become numb, or turn cold and blue. Keep the brace or splint clean. If the brace or  splint is not waterproof: Do not let it get wet. Cover it with a watertight covering when you take a bath or a shower. If you have an elastic bandage (dressing): Remove it to shower or bathe. Try not to move your ankle much, but wiggle your toes from time to time. This helps to prevent swelling. Adjust the dressing to make it more comfortable if it feels too tight. Loosen the dressing if you have numbness or tingling in your foot, or if your foot becomes cold and blue. Managing pain, stiffness, and swelling   Take over-the-counter and prescription medicines only as told by your health care provider. For 2-3 days, keep your ankle raised (elevated) above the level of your heart as much as possible. If directed, put ice on the injured area: If you have a removable brace or splint, remove it as told by your health care provider. Put ice in a plastic bag. Place a towel between your skin and the bag. Leave the ice on for 20 minutes, 2-3 times a day. General instructions Rest your ankle. Do not use the injured limb to support your body weight until your health care provider says that you can. Use crutches as told by your health care provider. Do not use any products that contain nicotine or tobacco, such as cigarettes, e-cigarettes, and chewing tobacco. If you need help quitting, ask your health care provider. Keep all follow-up visits as told by your health care provider. This is important. Contact a  health care provider if: You have rapidly increasing bruising or swelling. Your pain is not relieved with medicine. Get help right away if: Your foot or toes become numb or blue. You have severe pain that gets worse. Summary An ankle sprain is a stretch or tear in a ligament in the ankle. Ligaments are tissues that connect bones to each other. This condition is often caused by accidentally rolling or twisting the ankle. Symptoms include pain, swelling, bruising, and trouble walking. To relieve  pain and swelling, put ice on the affected ankle, raise your ankle above the level of your heart, and use an elastic bandage. Keep all follow-up visits as told by your health care provider. This is important. This information is not intended to replace advice given to you by your health care provider. Make sure you discuss any questions you have with your health care provider. Document Revised: 09/27/2017 Document Reviewed: 06/01/2017 Elsevier Patient Education  [place].  Ankle Sprain, Phase I Rehab An ankle sprain is an injury to the ligaments of your ankle. Ankle sprains cause stiffness, loss of motion, and loss of strength. Ask your health care provider which exercises are safe for you. Do exercises exactly as told by your health care provider and adjust them as directed. It is normal to feel mild stretching, pulling, tightness, or discomfort as you do these exercises. Stop right away if you feel sudden pain or your pain gets worse. Do not begin these exercises until told by your health care provider. Stretching and range-of-motion exercises These exercises warm up your muscles and joints and improve the movement and flexibility of your lower leg and ankle. These exercises also help to relieve pain and stiffness. Gastroc and soleus stretch  This exercise is also called a calf stretch. It stretches the muscles in the back of the lower leg. These muscles are the gastrocnemius, or gastroc, and the soleus. Sit on the floor with your left / right leg extended. Loop a belt or towel around the ball of your left / right foot. The ball of your foot is on the walking surface, right under your toes. Keep your left / right ankle and foot relaxed and keep your knee straight while you use the belt or towel to pull your foot toward you. You should feel a gentle stretch behind your calf or knee in your gastroc muscle. Hold this position for 15 seconds, then release to the starting position. Repeat the  exercise with your knee bent. You can put a pillow or a rolled bath towel under your knee to support it. You should feel a stretch deep in your calf in the soleus muscle or at your Achilles tendon. Repeat 5 times. Complete this exercise 2 times a day. Ankle alphabet   Sit with your left / right leg supported at the lower leg. Do not rest your foot on anything. Make sure your foot has room to move freely. Think of your left / right foot as a paintbrush. Move your foot to trace each letter of the alphabet in the air. Keep your hip and knee still while you trace. Make the letters as large as you can without feeling discomfort. Trace every letter from A to Z. Repeat 5 times. Complete this exercise 2 times a day. Strengthening exercises These exercises build strength and endurance in your ankle and lower leg. Endurance is the ability to use your muscles for a long time, even after they get tired. Ankle dorsiflexion   Secure  a rubber exercise band or tube to an object, such as a table leg, that will stay still when the band is pulled. Secure the other end around your left / right foot. Sit on the floor facing the object, with your left / right leg extended. The band or tube should be slightly tense when your foot is relaxed. Slowly bring your foot toward you, bringing the top of your foot toward your shin (dorsiflexion), and pulling the band tighter. Hold this position for 15 seconds. Slowly return your foot to the starting position. Repeat 5 times. Complete this exercise 2 times a day. Ankle plantar flexion   Sit on the floor with your left / right leg extended. Loop a rubber exercise tube or band around the ball of your left / right foot. The ball of your foot is on the walking surface, right under your toes. Hold the ends of the band or tube in your hands. The band or tube should be slightly tense when your foot is relaxed. Slowly point your foot and toes downward to tilt the top of your  foot away from your shin (plantar flexion). Hold this position for 15 seconds. Slowly return your foot to the starting position. Repeat 5 times. Complete this exercise 2times a day. Ankle eversion Sit on the floor with your legs straight out in front of you. Loop a rubber exercise band or tube around the ball of your left / right foot. The ball of your foot is on the walking surface, right under your toes. Hold the ends of the band in your hands, or secure the band to a stable object. The band or tube should be slightly tense when your foot is relaxed. Slowly push your foot outward, away from your other leg (eversion). Hold this position for 15 seconds. Slowly return your foot to the starting position. Repeat 5 times. Complete this exercise 2  times a day. This information is not intended to replace advice given to you by your health care provider. Make sure you discuss any questions you have with your health care provider. Document Revised: 04/26/2018 Document Reviewed: 10/18/2017 Elsevier Patient Education  2020 [employer].

## 2020-07-31 ENCOUNTER — Encounter: Payer: Self-pay | Admitting: *Deleted

## 2020-08-05 DIAGNOSIS — M79676 Pain in unspecified toe(s): Secondary | ICD-10-CM

## 2020-08-06 DIAGNOSIS — M79676 Pain in unspecified toe(s): Secondary | ICD-10-CM

## 2020-08-19 ENCOUNTER — Encounter: Payer: Medicare HMO | Admitting: Podiatry

## 2020-08-21 ENCOUNTER — Other Ambulatory Visit: Payer: Self-pay

## 2020-08-21 ENCOUNTER — Ambulatory Visit: Payer: Medicare HMO | Admitting: Podiatry

## 2020-08-21 ENCOUNTER — Encounter: Payer: Self-pay | Admitting: Podiatry

## 2020-08-21 ENCOUNTER — Telehealth: Payer: Self-pay

## 2020-08-21 DIAGNOSIS — M722 Plantar fascial fibromatosis: Secondary | ICD-10-CM

## 2020-08-21 DIAGNOSIS — S93401D Sprain of unspecified ligament of right ankle, subsequent encounter: Secondary | ICD-10-CM

## 2020-08-21 MED ORDER — METHYLPREDNISOLONE 4 MG PO TBPK: ORAL_TABLET | ORAL | 0 refills | Status: DC

## 2020-08-21 NOTE — Telephone Encounter (Signed)
Demographics and orders faxed to [facility] in [place] Evaluate and treat bilateral M72.2 and right S93.4010 Including active and passive ROM Ankle rehab  home exercise program

## 2020-08-21 NOTE — Patient Instructions (Signed)
PT referral will be sent to:  [facility] [address] [place], [place], [place] [address] [phone]

## 2020-08-25 ENCOUNTER — Encounter: Payer: Self-pay | Admitting: Podiatry

## 2020-08-25 NOTE — Progress Notes (Signed)
  Subjective:  Patient ID: Amy Bell, female    DOB: [birth year],  MRN: [id number]  Chief Complaint  Patient presents with   Ankle Injury    Follow up right ankle sprain    65 y.o. female returns for follow-up with the above complaint. History confirmed with patient.  Has had some further improvement still she has good days and bad days.  Planter fasciitis is still bothering her as well  Objective:  Physical Exam: warm, good capillary refill, no trophic changes or ulcerative lesions, normal DP and PT pulses, and normal sensory exam.  Bilateral plantar fasciitis Right Foot: still with pain over lateral ligamentous complex, no gross instability    Radiographs: Multiple views x-ray of left ankle: no fracture, dislocation, mild soft tissue swelling over the lateral ankle Assessment:   1. Sprain of right ankle, unspecified ligament, subsequent encounter   2. Plantar fasciitis      Plan:  Patient was evaluated and treated and all questions answered.  This point has been sometime since her initial injury and she has made progress but has not fully healed.  I recommend physical therapy to help with her planter fasciitis and sequela of her ankle sprain.  Like to reevaluate in about 1 month after some physical therapy.  Referral sent to [facility] in [place].  Consider advanced imaging such as MRI if not improving or regresses  Return in about 4 weeks (around 09/18/2020).

## 2020-08-28 DIAGNOSIS — M79676 Pain in unspecified toe(s): Secondary | ICD-10-CM

## 2020-09-18 ENCOUNTER — Other Ambulatory Visit: Payer: Self-pay

## 2020-09-18 ENCOUNTER — Ambulatory Visit: Payer: Medicare HMO | Admitting: Podiatry

## 2020-09-18 ENCOUNTER — Encounter: Payer: Self-pay | Admitting: Podiatry

## 2020-09-18 DIAGNOSIS — M722 Plantar fascial fibromatosis: Secondary | ICD-10-CM | POA: Diagnosis not present

## 2020-09-18 DIAGNOSIS — S93401D Sprain of unspecified ligament of right ankle, subsequent encounter: Secondary | ICD-10-CM

## 2020-09-18 NOTE — Progress Notes (Signed)
  Subjective:  Patient ID: Amy Bell, female    DOB: Nov 02, 1955,  MRN: [id number]  Chief Complaint  Patient presents with   Ankle Injury    4 week follow up right    65 y.o. female returns for follow-up with the above complaint. History confirmed with patient.  Her ankle continues to give her trouble she has not been able to complete all the physical therapy but even despite this she continues to not improved.  Her heels are hurting again and she is requesting injections for this  Objective:  Physical Exam: warm, good capillary refill, no trophic changes or ulcerative lesions, normal DP and PT pulses, and normal sensory exam.  Bilateral plantar fasciitis with pain on palpation to the insertion of the plantar fascia on the heel Right Foot: still with pain over lateral ligamentous complex, no gross instability    Radiographs: Multiple views x-ray of left ankle: no fracture, dislocation, mild soft tissue swelling over the lateral ankle Assessment:   1. Sprain of right ankle, unspecified ligament, subsequent encounter   2. Plantar fasciitis      Plan:  Patient was evaluated and treated and all questions answered.  After sterile prep with povidone-iodine solution and alcohol, the bilateral heel was injected with 0.5cc 2% xylocaine plain, 0.5cc 0.5% marcaine plain, '5mg'$  triamcinolone acetonide, and '2mg'$  dexamethasone was injected along the medial plantar fascia at the insertion on the plantar calcaneus. The patient tolerated the procedure well without complication.  Her ankle sprain continues to worsen and despite bracing and physical therapy both at home and with physical therapy she is not improved.  It should be healed at this point.  I am recommending an MRI of the ankle to evaluate for any lateral ankle pathology and possible instability or osteochondral lesions.  She will return to see me after the MRI.  This will be scheduled at [place] [facility] she is unable to get  to [facility] for this.  We will keep her out of work for 4 weeks until this is completed  Return in about 1 month (around 10/18/2020) for recheck plantar fasciitis, after MRI to review.

## 2020-09-25 DIAGNOSIS — M79676 Pain in unspecified toe(s): Secondary | ICD-10-CM

## 2020-10-02 ENCOUNTER — Telehealth: Payer: Self-pay | Admitting: Podiatry

## 2020-10-02 NOTE — Telephone Encounter (Signed)
Patient called today following up on her appt for her MRI. Patient states she wants her MRI at [facility] on [address]. Patients tele number is [phone]. Patient has not heard from anywhere in reference to her MRI for an appointment.

## 2020-10-07 ENCOUNTER — Telehealth: Payer: Self-pay | Admitting: Podiatry

## 2020-10-07 NOTE — Telephone Encounter (Signed)
Patient called asking about her MRI. Patient states she wants to get it done at [facility] on [address] in [place].

## 2020-10-09 NOTE — Telephone Encounter (Signed)
Called pt no answer LM on VM that we are working on her MRI. Told pt to call if she had any other questions.

## 2020-10-11 ENCOUNTER — Telehealth: Payer: Self-pay | Admitting: Podiatry

## 2020-10-11 ENCOUNTER — Encounter: Payer: Self-pay | Admitting: Podiatry

## 2020-10-21 ENCOUNTER — Ambulatory Visit (INDEPENDENT_AMBULATORY_CARE_PROVIDER_SITE_OTHER): Payer: Medicare HMO | Admitting: Podiatry

## 2020-10-21 ENCOUNTER — Other Ambulatory Visit: Payer: Self-pay

## 2020-10-21 ENCOUNTER — Encounter: Payer: Self-pay | Admitting: Podiatry

## 2020-10-21 DIAGNOSIS — M722 Plantar fascial fibromatosis: Secondary | ICD-10-CM | POA: Diagnosis not present

## 2020-10-21 DIAGNOSIS — S93401D Sprain of unspecified ligament of right ankle, subsequent encounter: Secondary | ICD-10-CM

## 2020-10-21 NOTE — Progress Notes (Signed)
  Subjective:  Patient ID: Amy Bell, female    DOB: Nov 24, 1955,  MRN: [id number]  Chief Complaint  Patient presents with   Tendonitis      1 month fu  right ankle sprain    65 y.o. female returns for follow-up with the above complaint. History confirmed with patient.  MRI has been approved and ordered but has not been scheduled yet for [facility] outpatient imaging on the right ankle.  Overall the ankle is doing better but the heel continues to be painful especially the left  Objective:  Physical Exam: warm, good capillary refill, no trophic changes or ulcerative lesions, normal DP and PT pulses, and normal sensory exam.  Bilateral plantar fasciitis with pain on palpation to the insertion of the plantar fascia on the heel Right Foot: No pain on lateral ankle good stability and improving. Left foot: Still has very sharp pain on the plantar heel and into the Achilles    Radiographs: Multiple views x-ray of left ankle: no fracture, dislocation, mild soft tissue swelling over the lateral ankle Assessment:   1. Sprain of right ankle, unspecified ligament, subsequent encounter   2. Plantar fasciitis      Plan:  Patient was evaluated and treated and all questions answered.  MRI is pending for the ankle  He will still continue to be quite painful.  When she continue physical therapy to work on especially with dry needling.  I recommended a night splint to stretch the plantar fascia and Achilles out which seems to be her area of most pain currently.  She think she may have 1 at home she is going to check and let me know and if not come back by the office to get on.  Return in about 1 month (around 11/21/2020) for recheck plantar fasciitis.

## 2020-11-07 ENCOUNTER — Telehealth: Payer: Self-pay | Admitting: *Deleted

## 2020-11-07 NOTE — Telephone Encounter (Signed)
"  I'm a patient of Dr. Sherryle Lis.  I've been trying to get someone at the front desk for the last week.  I can't get anyone.  I'm curious about what is going on as far as scheduling this MRI.  It's going into three months waiting. Do I need to schedule this?  I need to know what's going on.  If you could help me out, I'd greatly appreciate it."

## 2020-11-08 ENCOUNTER — Other Ambulatory Visit: Payer: Self-pay

## 2020-11-08 DIAGNOSIS — S93401A Sprain of unspecified ligament of right ankle, initial encounter: Secondary | ICD-10-CM

## 2020-11-08 DIAGNOSIS — S93401D Sprain of unspecified ligament of right ankle, subsequent encounter: Secondary | ICD-10-CM

## 2020-11-19 ENCOUNTER — Telehealth: Payer: Self-pay | Admitting: Podiatry

## 2020-11-19 NOTE — Telephone Encounter (Signed)
Pt call and asked about her mri on when it will be

## 2020-11-27 ENCOUNTER — Other Ambulatory Visit: Payer: Self-pay

## 2020-11-27 ENCOUNTER — Ambulatory Visit: Payer: Medicare HMO | Admitting: Podiatry

## 2020-11-27 DIAGNOSIS — S93401D Sprain of unspecified ligament of right ankle, subsequent encounter: Secondary | ICD-10-CM

## 2020-11-27 DIAGNOSIS — M722 Plantar fascial fibromatosis: Secondary | ICD-10-CM

## 2020-11-27 DIAGNOSIS — M79672 Pain in left foot: Secondary | ICD-10-CM

## 2020-12-01 NOTE — Progress Notes (Signed)
  Subjective:  Patient ID: Amy Bell, female    DOB: [birth year],  MRN: [id number]  Chief Complaint  Patient presents with   Plantar Fasciitis     for recheck plantar fasciitis    65 y.o. female returns for follow-up with the above complaint. History confirmed with patient.  MRI has been approved for the right ankle, continues to improve.  The left heel is still very painful this is been going on for several months and has not improved despite physical therapy and offloading and bracing and anti-inflammatories  Objective:  Physical Exam: warm, good capillary refill, no trophic changes or ulcerative lesions, normal DP and PT pulses, and normal sensory exam.  Bilateral plantar fasciitis with pain on palpation to the insertion of the plantar fascia on the heel Right Foot: No pain on lateral ankle good stability and improving. Left foot: Still has very sharp pain on the plantar heel and into the Achilles    Radiographs: Multiple views x-ray of left ankle: no fracture, dislocation, mild soft tissue swelling over the lateral ankle Assessment:   1. Plantar fasciitis   2. Sprain of right ankle, unspecified ligament, subsequent encounter   3. Left foot pain       Plan:  Patient was evaluated and treated and all questions answered.  MRI is pending for the ankle, she will get this scheduled for December or January at [facility] left heel  He will still continue to be quite painful.  She has not improved despite significant physical therapy and anti-inflammatories.  Recommend MRI for this as well now on the left side.  I have ordered this and we will have this scheduled at same time  No follow-ups on file.

## 2020-12-04 ENCOUNTER — Telehealth: Payer: Self-pay | Admitting: Podiatry

## 2020-12-04 NOTE — Telephone Encounter (Signed)
Can you send her left foot MRI from last visit to [facility] as well? Then they can schedule R ankle and L foot at same time

## 2020-12-04 NOTE — Telephone Encounter (Signed)
[facility] didn't get a mri order she said please send them a order so she can get her MRI done

## 2020-12-06 DIAGNOSIS — M79676 Pain in unspecified toe(s): Secondary | ICD-10-CM

## 2021-02-03 ENCOUNTER — Encounter: Payer: Self-pay | Admitting: Podiatry

## 2021-02-03 ENCOUNTER — Ambulatory Visit: Payer: Medicare HMO | Admitting: Podiatry

## 2021-02-03 ENCOUNTER — Other Ambulatory Visit: Payer: Self-pay

## 2021-02-03 DIAGNOSIS — M722 Plantar fascial fibromatosis: Secondary | ICD-10-CM | POA: Diagnosis not present

## 2021-02-03 MED ORDER — TRIAMCINOLONE ACETONIDE 40 MG/ML IJ SUSP
20.0000 mg | Freq: Once | INTRAMUSCULAR | Status: AC
  Administered 2021-02-03: 20 mg

## 2021-02-03 NOTE — Progress Notes (Signed)
She presents today for follow-up of her Planter fasciitis of her left heel.  States that is still hurting and very swollen she is supposed to have her MRI done but she failed to because of her insurance.  Objective: Vital signs are stable alert oriented x3.  There is no erythema edema cellulitis drainage or odor.  She has pain on palpation medial calcaneal tubercle of the left foot.  Assessment plan fasciitis left.  Some mild subtalar joint capsulitis right foot.  Plan: Injected the left heel today 20 mg Kenalog 5 mg Marcaine she will be followed up for MRI bilateral foot/ankle.

## 2021-02-11 ENCOUNTER — Telehealth: Payer: Self-pay | Admitting: Podiatry

## 2021-02-11 NOTE — Telephone Encounter (Signed)
Patient called stating she just wanted to give Dr Milinda Pointer a heads up about her MRI. Patient states it wont be until February or March she is waiting on the charity care to be approved.

## 2024-01-07 ENCOUNTER — Other Ambulatory Visit: Payer: Self-pay | Admitting: Gastroenterology

## 2024-01-07 DIAGNOSIS — R1031 Right lower quadrant pain: Secondary | ICD-10-CM

## 2024-01-15 ENCOUNTER — Other Ambulatory Visit: Payer: Self-pay | Admitting: Gastroenterology

## 2024-01-15 ENCOUNTER — Ambulatory Visit
Admission: RE | Admit: 2024-01-15 | Discharge: 2024-01-15 | Disposition: A | Source: Ambulatory Visit | Attending: Gastroenterology

## 2024-01-15 DIAGNOSIS — R1031 Right lower quadrant pain: Secondary | ICD-10-CM | POA: Insufficient documentation

## 2024-01-15 DIAGNOSIS — R1032 Left lower quadrant pain: Secondary | ICD-10-CM | POA: Diagnosis present

## 2024-01-15 MED ORDER — GADOBUTROL 1 MMOL/ML IV SOLN
7.0000 mL | Freq: Once | INTRAVENOUS | Status: AC | PRN
  Administered 2024-01-15: 7 mL via INTRAVENOUS

## 2024-01-16 ENCOUNTER — Ambulatory Visit: Payer: Self-pay | Admitting: Gastroenterology
